# Patient Record
Sex: Female | Born: 1985 | Race: Black or African American | Hispanic: No | Marital: Married | Smoking: Light tobacco smoker
Health system: Southern US, Community
[De-identification: ages and names within clinical notes are randomized; demographics above are authoritative.]

## PROBLEM LIST (undated history)

## (undated) DIAGNOSIS — Z789 Other specified health status: Secondary | ICD-10-CM

## (undated) DIAGNOSIS — Z3490 Encounter for supervision of normal pregnancy, unspecified, unspecified trimester: Secondary | ICD-10-CM

## (undated) DIAGNOSIS — I341 Nonrheumatic mitral (valve) prolapse: Secondary | ICD-10-CM

## (undated) HISTORY — DX: Nonrheumatic mitral (valve) prolapse: I34.1

## (undated) HISTORY — PX: TONSILLECTOMY: SUR1361

## (undated) HISTORY — PX: WISDOM TOOTH EXTRACTION: SHX21

## (undated) HISTORY — DX: Encounter for supervision of normal pregnancy, unspecified, unspecified trimester: Z34.90

---
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
## 2007-05-13 ENCOUNTER — Ambulatory Visit: Payer: Self-pay | Admitting: Otolaryngology

## 2010-07-07 ENCOUNTER — Emergency Department: Payer: Self-pay | Admitting: Emergency Medicine

## 2012-05-27 NOTE — L&D Delivery Note (Signed)
Delivery Note At 5:58 PM a viable and healthy female was delivered via Vaginal, Spontaneous Delivery (Presentation: Left Occiput Anterior).  APGAR: 8, 9; weight .   Placenta status: Intact, Spontaneous.  Cord: 3 vessels with the following complications: None.   Anesthesia: Epidural  Episiotomy: None Lacerations: 2nd degree Suture Repair: 3.0 vicryl on ct and 4.0 vicryl on CT Est. Blood Loss (mL): 300  Mom to postpartum.  Baby to Couplet care / Skin to Skin.  Tawana Scale 03/26/2013, 6:33 PM  NSVD over 2nd degree tear with active management of 3rd stage with pit and traction. spont delivery of placenta 3v cord and intact. Perilabial tear repaired with 4.0 vicryl on SH and 2nd degree repaired with 3.0 vicryl on CT in usual fashion. Hemostatic at completion. EBL 300. Counts sharps and sponges correct.   Delivery Summary for [employer]  Labor Events:   Preterm labor:   Rupture date:   Rupture time:   Rupture type: Spontaneous  Fluid Color: Yellow  Induction:   Augmentation:   Complications:   Cervical ripening:          Delivery:   Episiotomy:   Lacerations:   Repair suture:   Repair # of packets:   Blood loss (ml): 300

## 2012-08-19 LAB — OB RESULTS CONSOLE HIV ANTIBODY (ROUTINE TESTING): HIV: NONREACTIVE

## 2012-08-19 LAB — OB RESULTS CONSOLE ABO/RH: RH Type: POSITIVE

## 2012-08-19 LAB — OB RESULTS CONSOLE GC/CHLAMYDIA
Chlamydia: NEGATIVE
Gonorrhea: NEGATIVE

## 2012-08-19 LAB — OB RESULTS CONSOLE RUBELLA ANTIBODY, IGM: Rubella: IMMUNE

## 2012-08-19 LAB — OB RESULTS CONSOLE HEPATITIS B SURFACE ANTIGEN: Hepatitis B Surface Ag: NEGATIVE

## 2012-08-19 LAB — OB RESULTS CONSOLE ANTIBODY SCREEN: Antibody Screen: NEGATIVE

## 2012-08-19 LAB — OB RESULTS CONSOLE RPR: RPR: NONREACTIVE

## 2012-10-21 ENCOUNTER — Encounter: Payer: Self-pay | Admitting: *Deleted

## 2012-10-21 ENCOUNTER — Ambulatory Visit (INDEPENDENT_AMBULATORY_CARE_PROVIDER_SITE_OTHER): Payer: BC Managed Care – PPO | Admitting: *Deleted

## 2012-10-21 VITALS — BP 109/62 | Ht 69.5 in | Wt 124.0 lb

## 2012-10-21 DIAGNOSIS — Z3480 Encounter for supervision of other normal pregnancy, unspecified trimester: Secondary | ICD-10-CM

## 2012-10-21 DIAGNOSIS — Z3482 Encounter for supervision of other normal pregnancy, second trimester: Secondary | ICD-10-CM

## 2012-10-21 NOTE — Progress Notes (Signed)
Patient has been seen for prenatal care and will sign a records release to have her labs sent to [place] as well as her first and second trimester screening that was done.  She was scheduled today for her anatomy scan as she is 18 weeks.  She will follow up after the scan to be seen by the physician.

## 2012-10-21 NOTE — Patient Instructions (Signed)
Pregabalin capsules What is this medicine? PREGABALIN (pre GAB a lin) is used to treat nerve pain from diabetes, shingles, spinal cord injury, and fibromyalgia. It is also used to control seizures in epilepsy. This medicine may be used for other purposes; ask your health care provider or pharmacist if you have questions. What should I tell my health care provider before I take this medicine? They need to know if you have any of these conditions: -bleeding problems -heart disease, including heart failure -history of alcohol or drug abuse -kidney disease -suicidal thoughts, plans, or attempt; a previous suicide attempt by you or a family member -an unusual or allergic reaction to pregabalin, gabapentin, other medicines, foods, dyes, or preservatives -pregnant or trying to get pregnant or trying to conceive with your partner -breast-feeding How should I use this medicine? Take this medicine by mouth with a glass of water. Follow the directions on the prescription label. You can take this medicine with or without food. Take your doses at regular intervals. Do not take your medicine more often than directed. Do not stop taking except on your doctor's advice. A special MedGuide will be given to you by the pharmacist with each prescription and refill. Be sure to read this information carefully each time. Talk to your pediatrician regarding the use of this medicine in children. Special care may be needed. Overdosage: If you think you have taken too much of this medicine contact a poison control center or emergency room at once. NOTE: This medicine is only for you. Do not share this medicine with others. What if I miss a dose? If you miss a dose, take it as soon as you can. If it is almost time for your next dose, take only that dose. Do not take double or extra doses. What may interact with this medicine? -alcohol -certain medicines for blood pressure like captopril, enalapril, or  lisinopril -certain medicines for diabetes, like pioglitazone or rosiglitazone -certain medicines for anxiety or sleep -narcotic medicines for pain This list may not describe all possible interactions. Give your health care provider a list of all the medicines, herbs, non-prescription drugs, or dietary supplements you use. Also tell them if you smoke, drink alcohol, or use illegal drugs. Some items may interact with your medicine. What should I watch for while using this medicine? Tell your doctor or healthcare professional if your symptoms do not start to get better or if they get worse. Visit your doctor or health care professional for regular checks on your progress. Do not stop taking except on your doctor's advice. You may develop a severe reaction. Your doctor will tell you how much medicine to take. Wear a medical identification bracelet or chain if you are taking this medicine for seizures, and carry a card that describes your disease and details of your medicine and dosage times. You may get drowsy or dizzy. Do not drive, use machinery, or do anything that needs mental alertness until you know how this medicine affects you. Do not stand or sit up quickly, especially if you are an older patient. This reduces the risk of dizzy or fainting spells. Alcohol may interfere with the effect of this medicine. Avoid alcoholic drinks. If you have a heart condition, like congestive heart failure, and notice that you are retaining water and have swelling in your hands or feet, contact your health care provider immediately. The use of this medicine may increase the chance of suicidal thoughts or actions. Pay special attention to how you are   responding while on this medicine. Any worsening of mood, or thoughts of suicide or dying should be reported to your health care professional right away. This medicine has caused reduced sperm counts in some men. This may interfere with the ability to father a child. You  should talk to your doctor or health care professional if you are concerned about your fertility. Women who become pregnant while using this medicine for seizures may enroll in the [place] [employer] Antiepileptic Drug Pregnancy Registry by calling ([phone]. This registry collects information about the safety of antiepileptic drug use during pregnancy. What side effects may I notice from receiving this medicine? Side effects that you should report to your doctor or health care professional as soon as possible: -allergic reactions like skin rash, itching or hives, swelling of the face, lips, or tongue -breathing problems -changes in vision -chest pain -confusion -jerking or unusual movements of any part of your body -loss of memory -muscle pain, tenderness, or weakness -suicidal thoughts or other mood changes -swelling of the ankles, feet, hands -unusual bruising or bleeding Side effects that usually do not require medical attention (Report these to your doctor or health care professional if they continue or are bothersome.): -dizziness -drowsiness -dry mouth -headache -nausea -tremors -trouble sleeping -weight gain This list may not describe all possible side effects. Call your doctor for medical advice about side effects. You may report side effects to FDA at 1-800-FDA-1088. Where should I keep my medicine? Keep out of the reach of children. This medicine can be abused. Keep your medicine in a safe place to protect it from theft. Do not share this medicine with anyone. Selling or giving away this medicine is dangerous and against the law. Store at room temperature between 15 and 30 degrees C (59 and 86 degrees F). Throw away any unused medicine after the expiration date. NOTE: This sheet is a summary. It may not cover all possible information. If you have questions about this medicine, talk to your doctor, pharmacist, or health care provider.  2012, [employer]. (11/15/2010  8:00:36 PM)

## 2012-10-26 ENCOUNTER — Ambulatory Visit (HOSPITAL_COMMUNITY)
Admission: RE | Admit: 2012-10-26 | Discharge: 2012-10-26 | Disposition: A | Discharge status: Home / Self Care | Payer: BC Managed Care – PPO | Source: Ambulatory Visit | Attending: Obstetrics & Gynecology | Admitting: Obstetrics & Gynecology

## 2012-10-26 DIAGNOSIS — Z3482 Encounter for supervision of other normal pregnancy, second trimester: Secondary | ICD-10-CM

## 2012-10-26 DIAGNOSIS — Z3689 Encounter for other specified antenatal screening: Secondary | ICD-10-CM | POA: Insufficient documentation

## 2012-10-27 ENCOUNTER — Encounter: Payer: Self-pay | Admitting: Obstetrics & Gynecology

## 2012-10-27 DIAGNOSIS — Z3490 Encounter for supervision of normal pregnancy, unspecified, unspecified trimester: Secondary | ICD-10-CM

## 2012-10-27 HISTORY — DX: Encounter for supervision of normal pregnancy, unspecified, unspecified trimester: Z34.90

## 2012-10-28 ENCOUNTER — Ambulatory Visit (INDEPENDENT_AMBULATORY_CARE_PROVIDER_SITE_OTHER): Payer: BC Managed Care – PPO | Admitting: Family Medicine

## 2012-10-28 ENCOUNTER — Encounter: Payer: Self-pay | Admitting: Family Medicine

## 2012-10-28 VITALS — BP 114/65 | Wt 125.0 lb

## 2012-10-28 DIAGNOSIS — I341 Nonrheumatic mitral (valve) prolapse: Secondary | ICD-10-CM

## 2012-10-28 DIAGNOSIS — I059 Rheumatic mitral valve disease, unspecified: Secondary | ICD-10-CM

## 2012-10-28 DIAGNOSIS — Z3492 Encounter for supervision of normal pregnancy, unspecified, second trimester: Secondary | ICD-10-CM

## 2012-10-28 DIAGNOSIS — Z3480 Encounter for supervision of other normal pregnancy, unspecified trimester: Secondary | ICD-10-CM

## 2012-10-28 NOTE — Progress Notes (Signed)
Anatomy U/S reviewed--looks normal needs f/u spine views Transfer from [facility] records.  She reports genetics screen, but unsure of outcome.

## 2012-10-28 NOTE — Patient Instructions (Signed)
Pregnancy - Second Trimester The second trimester of pregnancy (3 to 6 months) is a period of rapid growth for you and your baby. At the end of the sixth month, your baby is about 9 inches long and weighs 1 1/2 pounds. You will begin to feel the baby move between 18 and 20 weeks of the pregnancy. This is called quickening. Weight gain is faster. A clear fluid (colostrum) may leak out of your breasts. You may feel small contractions of the womb (uterus). This is known as false labor or Braxton-Hicks contractions. This is like a practice for labor when the baby is ready to be born. Usually, the problems with morning sickness have usually passed by the end of your first trimester. Some women develop small dark blotches (called cholasma, mask of pregnancy) on their face that usually goes away after the baby is born. Exposure to the sun makes the blotches worse. Acne may also develop in some pregnant women and pregnant women who have acne, may find that it goes away. PRENATAL EXAMS  Blood work may continue to be done during prenatal exams. These tests are done to check on your health and the probable health of your baby. Blood work is used to follow your blood levels (hemoglobin). Anemia (low hemoglobin) is common during pregnancy. Iron and vitamins are given to help prevent this. You will also be checked for diabetes between 24 and 28 weeks of the pregnancy. Some of the previous blood tests may be repeated.  The size of the uterus is measured during each visit. This is to make sure that the baby is continuing to grow properly according to the dates of the pregnancy.  Your blood pressure is checked every prenatal visit. This is to make sure you are not getting toxemia.  Your urine is checked to make sure you do not have an infection, diabetes or protein in the urine.  Your weight is checked often to make sure gains are happening at the suggested rate. This is to ensure that both you and your baby are  growing normally.  Sometimes, an ultrasound is performed to confirm the proper growth and development of the baby. This is a test which bounces harmless sound waves off the baby so your caregiver can more accurately determine due dates. Sometimes, a test is done on the amniotic fluid surrounding the baby. This test is called an amniocentesis. The amniotic fluid is obtained by sticking a needle into the belly (abdomen). This is done to check the chromosomes in instances where there is a concern about possible genetic problems with the baby. It is also sometimes done near the end of pregnancy if an early delivery is required. In this case, it is done to help make sure the baby's lungs are mature enough for the baby to live outside of the womb. CHANGES OCCURING IN THE SECOND TRIMESTER OF PREGNANCY Your body goes through many changes during pregnancy. They vary from person to person. Talk to your caregiver about changes you notice that you are concerned about.  During the second trimester, you will likely have an increase in your appetite. It is normal to have cravings for certain foods. This varies from person to person and pregnancy to pregnancy.  Your lower abdomen will begin to bulge.  You may have to urinate more often because the uterus and baby are pressing on your bladder. It is also common to get more bladder infections during pregnancy. You can help this by drinking lots of fluids   and emptying your bladder before and after intercourse.  You may begin to get stretch marks on your hips, abdomen, and breasts. These are normal changes in the body during pregnancy. There are no exercises or medicines to take that prevent this change.  You may begin to develop swollen and bulging veins (varicose veins) in your legs. Wearing support hose, elevating your feet for 15 minutes, 3 to 4 times a day and limiting salt in your diet helps lessen the problem.  Heartburn may develop as the uterus grows and  pushes up against the stomach. Antacids recommended by your caregiver helps with this problem. Also, eating smaller meals 4 to 5 times a day helps.  Constipation can be treated with a stool softener or adding bulk to your diet. Drinking lots of fluids, and eating vegetables, fruits, and whole grains are helpful.  Exercising is also helpful. If you have been very active up until your pregnancy, most of these activities can be continued during your pregnancy. If you have been less active, it is helpful to start an exercise program such as walking.  Hemorrhoids may develop at the end of the second trimester. Warm sitz baths and hemorrhoid cream recommended by your caregiver helps hemorrhoid problems.  Backaches may develop during this time of your pregnancy. Avoid heavy lifting, wear low heal shoes, and practice good posture to help with backache problems.  Some pregnant women develop tingling and numbness of their hand and fingers because of swelling and tightening of ligaments in the wrist (carpel tunnel syndrome). This goes away after the baby is born.  As your breasts enlarge, you may have to get a bigger bra. Get a comfortable, cotton, support bra. Do not get a nursing bra until the last month of the pregnancy if you will be nursing the baby.  You may get a dark line from your belly button to the pubic area called the linea nigra.  You may develop rosy cheeks because of increase blood flow to the face.  You may develop spider looking lines of the face, neck, arms, and chest. These go away after the baby is born. HOME CARE INSTRUCTIONS   It is extremely important to avoid all smoking, herbs, alcohol, and unprescribed drugs during your pregnancy. These chemicals affect the formation and growth of the baby. Avoid these chemicals throughout the pregnancy to ensure the delivery of a healthy infant.  Most of your home care instructions are the same as suggested for the first trimester of your  pregnancy. Keep your caregiver's appointments. Follow your caregiver's instructions regarding medicine use, exercise, and diet.  During pregnancy, you are providing food for you and your baby. Continue to eat regular, well-balanced meals. Choose foods such as meat, fish, milk and other low fat dairy products, vegetables, fruits, and whole-grain breads and cereals. Your caregiver will tell you of the ideal weight gain.  A physical sexual relationship may be continued up until near the end of pregnancy if there are no other problems. Problems could include early (premature) leaking of amniotic fluid from the membranes, vaginal bleeding, abdominal pain, or other medical or pregnancy problems.  Exercise regularly if there are no restrictions. Check with your caregiver if you are unsure of the safety of some of your exercises. The greatest weight gain will occur in the last 2 trimesters of pregnancy. Exercise will help you:  Control your weight.  Get you in shape for labor and delivery.  Lose weight after you have the baby.  Wear   a good support or jogging bra for breast tenderness during pregnancy. This may help if worn during sleep. Pads or tissues may be used in the bra if you are leaking colostrum.  Do not use hot tubs, steam rooms or saunas throughout the pregnancy.  Wear your seat belt at all times when driving. This protects you and your baby if you are in an accident.  Avoid raw meat, uncooked cheese, cat litter boxes, and soil used by cats. These carry germs that can cause birth defects in the baby.  The second trimester is also a good time to visit your dentist for your dental health if this has not been done yet. Getting your teeth cleaned is okay. Use a soft toothbrush. Brush gently during pregnancy.  It is easier to leak urine during pregnancy. Tightening up and strengthening the pelvic muscles will help with this problem. Practice stopping your urination while you are going to the  bathroom. These are the same muscles you need to strengthen. It is also the muscles you would use as if you were trying to stop from passing gas. You can practice tightening these muscles up 10 times a set and repeating this about 3 times per day. Once you know what muscles to tighten up, do not perform these exercises during urination. It is more likely to contribute to an infection by backing up the urine.  Ask for help if you have financial, counseling, or nutritional needs during pregnancy. Your caregiver will be able to offer counseling for these needs as well as refer you for other special needs.  Your skin may become oily. If so, wash your face with mild soap, use non-greasy moisturizer and oil or cream based makeup. MEDICINES AND DRUG USE IN PREGNANCY  Take prenatal vitamins as directed. The vitamin should contain 1 milligram of folic acid. Keep all vitamins out of reach of children. Only a couple vitamins or tablets containing iron may be fatal to a baby or young child when ingested.  Avoid use of all medicines, including herbs, over-the-counter medicines, not prescribed or suggested by your caregiver. Only take over-the-counter or prescription medicines for pain, discomfort, or fever as directed by your caregiver. Do not use aspirin.  Let your caregiver also know about herbs you may be using.  Alcohol is related to a number of birth defects. This includes fetal alcohol syndrome. All alcohol, in any form, should be avoided completely. Smoking will cause low birth rate and premature babies.  Street or illegal drugs are very harmful to the baby. They are absolutely forbidden. A baby born to an addicted mother will be addicted at birth. The baby will go through the same withdrawal an adult does. SEEK MEDICAL CARE IF:  You have any concerns or worries during your pregnancy. It is better to call with your questions if you feel they cannot wait, rather than worry about them. SEEK IMMEDIATE  MEDICAL CARE IF:   An unexplained oral temperature above 102 F (38.9 C) develops, or as your caregiver suggests.  You have leaking of fluid from the vagina (birth canal). If leaking membranes are suspected, take your temperature and tell your caregiver of this when you call.  There is vaginal spotting, bleeding, or passing clots. Tell your caregiver of the amount and how many pads are used. Light spotting in pregnancy is common, especially following intercourse.  You develop a bad smelling vaginal discharge with a change in the color from clear to white.  You continue to feel   sick to your stomach (nauseated) and have no relief from remedies suggested. You vomit blood or coffee ground-like materials.  You lose more than 2 pounds of weight or gain more than 2 pounds of weight over 1 week, or as suggested by your caregiver.  You notice swelling of your face, hands, feet, or legs.  You get exposed to German measles and have never had them.  You are exposed to fifth disease or chickenpox.  You develop belly (abdominal) pain. Round ligament discomfort is a common non-cancerous (benign) cause of abdominal pain in pregnancy. Your caregiver still must evaluate you.  You develop a bad headache that does not go away.  You develop fever, diarrhea, pain with urination, or shortness of breath.  You develop visual problems, blurry, or double vision.  You fall or are in a car accident or any kind of trauma.  There is mental or physical violence at home. Document Released: 05/07/2001 Document Revised: 02/05/2012 Document Reviewed: 11/09/2008 [employer] Patient Information 2014 [employer].  Breastfeeding A change in hormones during your pregnancy causes growth of your breast tissue and an increase in number and size of milk ducts. The hormone prolactin allows proteins, sugars, and fats from your blood supply to make breast milk in your milk-producing glands. The hormone progesterone prevents  breast milk from being released before the birth of your baby. After the birth of your baby, your progesterone level decreases allowing breast milk to be released. Thoughts of your baby, as well as his or her sucking or crying, can stimulate the release of milk from the milk-producing glands. Deciding to breastfeed (nurse) is one of the best choices you can make for you and your baby. The information that follows gives a brief review of the benefits, as well as other important skills to know about breastfeeding. BENEFITS OF BREASTFEEDING For your baby  The first milk (colostrum) helps your baby's digestive system function better.   There are antibodies in your milk that help your baby fight off infections.   Your baby has a lower incidence of asthma, allergies, and sudden infant death syndrome (SIDS).   The nutrients in breast milk are better for your baby than infant formulas.  Breast milk improves your baby's brain development.   Your baby will have less gas, colic, and constipation.  Your baby is less likely to develop other conditions, such as childhood obesity, asthma, or diabetes mellitus. For you  Breastfeeding helps develop a very special bond between you and your baby.   Breastfeeding is convenient, always available at the correct temperature, and costs nothing.   Breastfeeding helps to burn calories and helps you lose the weight gained during pregnancy.   Breastfeeding makes your uterus contract back down to normal size faster and slows bleeding following delivery.   Breastfeeding mothers have a lower risk of developing osteoporosis or breast or ovarian cancer later in life.  BREASTFEEDING FREQUENCY  A healthy, full-term baby may breastfeed as often as every hour or space his or her feedings to every 3 hours. Breastfeeding frequency will vary from baby to baby.   Newborns should be fed no less than every 2 3 hours during the day and every 4 5 hours during the  night. You should breastfeed a minimum of 8 feedings in a 24 hour period.  Awaken your baby to breastfeed if it has been 3 4 hours since the last feeding.  Breastfeed when you feel the need to reduce the fullness of your breasts or when   your newborn shows signs of hunger. Signs that your baby may be hungry include:  Increased alertness or activity.  Stretching.  Movement of the head from side to side.  Movement of the head and opening of the mouth when the corner of the mouth or cheek is stroked (rooting).  Increased sucking sounds, smacking lips, cooing, sighing, or squeaking.  Hand-to-mouth movements.  Increased sucking of fingers or hands.  Fussing.  Intermittent crying.  Signs of extreme hunger will require calming and consoling before you try to feed your baby. Signs of extreme hunger may include:  Restlessness.  A loud, strong cry.  Screaming.  Frequent feeding will help you make more milk and will help prevent problems, such as sore nipples and engorgement of the breasts.  BREASTFEEDING   Whether lying down or sitting, be sure that the baby's abdomen is facing your abdomen.   Support your breast with 4 fingers under your breast and your thumb above your nipple. Make sure your fingers are well away from your nipple and your baby's mouth.   Stroke your baby's lips gently with your finger or nipple.   When your baby's mouth is open wide enough, place all of your nipple and as much of the colored area around your nipple (areola) as possible into your baby's mouth.  More areola should be visible above his or her upper lip than below his or her lower lip.  Your baby's tongue should be between his or her lower gum and your breast.  Ensure that your baby's mouth is correctly positioned around the nipple (latched). Your baby's lips should create a seal on your breast.  Signs that your baby has effectively latched onto your nipple include:  Tugging or sucking  without pain.  Swallowing heard between sucks.  Absent click or smacking sound.  Muscle movement above and in front of his or her ears with sucking.  Your baby must suck about 2 3 minutes in order to get your milk. Allow your baby to feed on each breast as long as he or she wants. Nurse your baby until he or she unlatches or falls asleep at the first breast, then offer the second breast.  Signs that your baby is full and satisfied include:  A gradual decrease in the number of sucks or complete cessation of sucking.  Falling asleep.  Extension or relaxation of his or her body.  Retention of a small amount of milk in his or her mouth.  Letting go of your breast by himself or herself.  Signs of effective breastfeeding in you include:  Breasts that have increased firmness, weight, and size prior to feeding.  Breasts that are softer after nursing.  Increased milk volume, as well as a change in milk consistency and color by the 5th day of breastfeeding.  Breast fullness relieved by breastfeeding.  Nipples are not sore, cracked, or bleeding.  If needed, break the suction by putting your finger into the corner of your baby's mouth and sliding your finger between his or her gums. Then, remove your breast from his or her mouth.  It is common for babies to spit up a small amount after a feeding.  Babies often swallow air during feeding. This can make babies fussy. Burping your baby between breasts can help with this.  Vitamin D supplements are recommended for babies who get only breast milk.  Avoid using a pacifier during your baby's first 4 6 weeks.  Avoid supplemental feedings of water, formula, or   juice in place of breastfeeding. Breast milk is all the food your baby needs. It is not necessary for your baby to have water or formula. Your breasts will make more milk if supplemental feedings are avoided during the early weeks. HOW TO TELL WHETHER YOUR BABY IS GETTING ENOUGH BREAST  MILK Wondering whether or not your baby is getting enough milk is a common concern among mothers. You can be assured that your baby is getting enough milk if:   Your baby is actively sucking and you hear swallowing.   Your baby seems relaxed and satisfied after a feeding.   Your baby nurses at least 8 12 times in a 24 hour time period.  During the first 3 5 days of age:  Your baby is wetting at least 3 5 diapers in a 24 hour period. The urine should be clear and pale yellow.  Your baby is having at least 3 4 stools in a 24 hour period. The stool should be soft and yellow.  At 5 7 days of age, your baby is having at least 3 6 stools in a 24 hour period. The stool should be seedy and yellow by 5 days of age.  Your baby has a weight loss less than 7 10% during the first 3 days of age.  Your baby does not lose weight after 3 7 days of age.  Your baby gains 4 7 ounces each week after he or she is 4 days of age.  Your baby gains weight by 5 days of age and is back to birth weight within 2 weeks. ENGORGEMENT In the first week after your baby is born, you may experience extremely full breasts (engorgement). When engorged, your breasts may feel heavy, warm, or tender to the touch. Engorgement peaks within 24 48 hours after delivery of your baby.  Engorgement may be reduced by:  Continuing to breastfeed.  Increasing the frequency of breastfeeding.  Taking warm showers or applying warm, moist heat to your breasts just before each feeding. This increases circulation and helps the milk flow.   Gently massaging your breast before and during the feedings. With your fingertips, massage from your chest wall towards your nipple in a circular motion.   Ensuring that your baby empties at least one breast at every feeding. It also helps to start the next feeding on the opposite breast.   Expressing breast milk by hand or by using a breast pump to empty the breasts if your baby is sleepy, or  not nursing well. You may also want to express milk if you are returning to work oryou feel you are getting engorged.  Ensuring your baby is latched on and positioned properly while breastfeeding. If you follow these suggestions, your engorgement should improve in 24 48 hours. If you are still experiencing difficulty, call your lactation consultant or caregiver.  CARING FOR YOURSELF Take care of your breasts.  Bathe or shower daily.   Avoid using soap on your nipples.   Wear a supportive bra. Avoid wearing underwire style bras.  Air dry your nipples for a 3 4minutes after each feeding.   Use only cotton bra pads to absorb breast milk leakage. Leaking of breast milk between feedings is normal.   Use only pure lanolin on your nipples after nursing. You do not need to wash it off before feeding your baby again. Another option is to express a few drops of breast milk and gently massage that milk into your nipples.  Continue   breast self-awareness checks. Take care of yourself.  Eat healthy foods. Alternate 3 meals with 3 snacks.  Avoid foods that you notice affect your baby in a bad way.  Drink milk, fruit juice, and water to satisfy your thirst (about 8 glasses a day).   Rest often, relax, and take your prenatal vitamins to prevent fatigue, stress, and anemia.  Avoid chewing and smoking tobacco.  Avoid alcohol and drug use.  Take over-the-counter and prescribed medicine only as directed by your caregiver or pharmacist. You should always check with your caregiver or pharmacist before taking any new medicine, vitamin, or herbal supplement.  Know that pregnancy is possible while breastfeeding. If desired, talk to your caregiver about family planning and safe birth control methods that may be used while breastfeeding. SEEK MEDICAL CARE IF:   You feel like you want to stop breastfeeding or have become frustrated with breastfeeding.  You have painful breasts or nipples.  Your  nipples are cracked or bleeding.  Your breasts are red, tender, or warm.  You have a swollen area on either breast.  You have a fever or chills.  You have nausea or vomiting.  You have drainage from your nipples.  Your breasts do not become full before feedings by the 5th day after delivery.  You feel sad and depressed.  Your baby is too sleepy to eat well.  Your baby is having trouble sleeping.   Your baby is wetting less than 3 diapers in a 24 hour period.  Your baby has less than 3 stools in a 24 hour period.  Your baby's skin or the white part of his or her eyes becomes more yellow.   Your baby is not gaining weight by 5 days of age. MAKE SURE YOU:   Understand these instructions.  Will watch your condition.  Will get help right away if you are not doing well or get worse. Document Released: 05/13/2005 Document Revised: 02/05/2012 Document Reviewed: 12/18/2011 [employer] Patient Information 2014 [employer].  

## 2012-10-28 NOTE — Progress Notes (Signed)
[id number]=[phone] 

## 2012-11-05 ENCOUNTER — Ambulatory Visit (HOSPITAL_COMMUNITY)
Admission: RE | Admit: 2012-11-05 | Discharge: 2012-11-05 | Disposition: A | Discharge status: Home / Self Care | Payer: BC Managed Care – PPO | Source: Ambulatory Visit | Attending: Family Medicine | Admitting: Family Medicine

## 2012-11-05 ENCOUNTER — Other Ambulatory Visit: Payer: Self-pay | Admitting: Family Medicine

## 2012-11-05 DIAGNOSIS — Z3492 Encounter for supervision of normal pregnancy, unspecified, second trimester: Secondary | ICD-10-CM

## 2012-11-05 DIAGNOSIS — Z3689 Encounter for other specified antenatal screening: Secondary | ICD-10-CM | POA: Insufficient documentation

## 2012-11-25 ENCOUNTER — Ambulatory Visit (INDEPENDENT_AMBULATORY_CARE_PROVIDER_SITE_OTHER): Payer: BC Managed Care – PPO | Admitting: Obstetrics & Gynecology

## 2012-11-25 VITALS — BP 113/72 | Wt 132.0 lb

## 2012-11-25 DIAGNOSIS — I341 Nonrheumatic mitral (valve) prolapse: Secondary | ICD-10-CM

## 2012-11-25 DIAGNOSIS — I059 Rheumatic mitral valve disease, unspecified: Secondary | ICD-10-CM

## 2012-11-25 DIAGNOSIS — Z3480 Encounter for supervision of other normal pregnancy, unspecified trimester: Secondary | ICD-10-CM

## 2012-11-25 DIAGNOSIS — Z3492 Encounter for supervision of normal pregnancy, unspecified, second trimester: Secondary | ICD-10-CM

## 2012-11-25 NOTE — Patient Instructions (Signed)
Tetanus, Diphtheria (Td) or Tetanus, Diphtheria, Pertussis (Tdap) Vaccine What You Need to Know WHY GET VACCINATED? Tetanus, diphtheria and pertussis can be very serious diseases. TETANUS (Lockjaw) causes painful muscle spasms and stiffness, usually all over the body.  Tetanus can lead to tightening of muscles in the head and neck so the victim cannot open his mouth or swallow, or sometimes even breathe. Tetanus kills about 1 out of 5 people who are infected. DIPHTHERIA can cause a thick membrane to cover the back of the throat.  Diphtheria can lead to breathing problems, paralysis, heart failure, and even death. PERTUSSIS (Whooping Cough) causes severe coughing spells which can lead to difficulty breathing, vomiting, and disturbed sleep.  Pertussis can lead to weight loss, incontinence, rib fractures and passing out from violent coughing. Up to 2 in 100 adolescents and 5 in 100 adults with pertussis are hospitalized or have complications, including pneumonia and death. These 3 diseases are all caused by bacteria. Diphtheria and pertussis are spread from person to person. Tetanus enters the body through cuts, scratches, or wounds. The [place] saw as many as 200,000 cases a year of diphtheria and pertussis before vaccines were available, and hundreds of cases of tetanus. Since then, tetanus and diphtheria cases have dropped by about 99% and pertussis cases by about 92%. Children 6 years of age and younger get DTaP vaccine to protect them from these three diseases. But older children, adolescents, and adults need protection too. VACCINES FOR ADOLESCENTS AND ADULTS: TD AND TDAP Two vaccines are available to protect people 7 years of age and older from these diseases:  Td vaccine has been used for many years. It protects against tetanus and diphtheria.  Tdap vaccine was licensed in 2005. It is the first vaccine for adolescents and adults that protects against pertussis as well as tetanus and  diphtheria. A Td booster dose is recommended every 10 years. Tdap is given only once. WHICH VACCINE, AND WHEN? Ages 7 through 18 years  A dose of Tdap is recommended at age 11 or 12. This dose could be given as early as age 7 for children who missed one or more childhood doses of DTaP.  Children and adolescents who did not get a complete series of DTaP shots by age 7 should complete the series using a combination of Td and Tdap. Age 19 years and Older  All adults should get a booster dose of Td every 10 years. Adults under 65 who have never gotten Tdap should get a dose of Tdap as their next booster dose. Adults 65 and older may get one booster dose of Tdap.  Adults (including women who may become pregnant and adults 65 and older) who expect to have close contact with a baby younger than 12 months of age should get a dose of Tdap to help protect the baby from pertussis.  Healthcare professionals who have direct patient contact in hospitals or clinics should get one dose of Tdap. Protection After a Wound  A person who gets a severe cut or burn might need a dose of Td or Tdap to prevent tetanus infection. Tdap should be used for anyone who has never had a dose previously. Td should be used if Tdap is not available, or for:  Anybody who has already had a dose of Tdap.  Children 7 through 9 years of age who completed the childhood DTaP series.  Adults 65 and older. Pregnant Women  Pregnant women who have never had a dose of Tdap   should get one, after the 20th week of gestation and preferably during the 3rd trimester. If they do not get Tdap during their pregnancy they should get a dose as soon as possible after delivery. Pregnant women who have previously received Tdap and need tetanus or diphtheria vaccine while pregnant should get Td. Tdap and Td may be given at the same time as other vaccines. SOME PEOPLE SHOULD NOT BE VACCINATED OR SHOULD WAIT  Anyone who has had a life-threatening  allergic reaction after a dose of any tetanus, diphtheria, or pertussis containing vaccine should not get Td or Tdap.  Anyone who has a severe allergy to any component of a vaccine should not get that vaccine. Tell your doctor if the person getting the vaccine has any severe allergies.  Anyone who had a coma, or long or multiple seizures within 7 days after a dose of DTP or DTaP should not get Tdap, unless a cause other than the vaccine was found. These people may get Td.  Talk to your doctor if the person getting either vaccine:  Has epilepsy or another nervous system problem.  Had severe swelling or severe pain after a previous dose of DTP, DTaP, DT, Td, or Tdap vaccine.  Has had Guillain Barr Syndrome (GBS). Anyone who has a moderate or severe illness on the day the shot is scheduled should usually wait until they recover before getting Tdap or Td vaccine. A person with a mild illness or low fever can usually be vaccinated. WHAT ARE THE RISKS FROM TDAP AND TD VACCINES? With a vaccine, as with any medicine, there is always a small risk of a life-threatening allergic reaction or other serious problem. Brief fainting spells and related symptoms (such as jerking movements) can happen after any medical procedure, including vaccination. Sitting or lying down for about 15 minutes after a vaccination can help prevent fainting and injuries caused by falls. Tell your doctor if the patient feels dizzy or lightheaded, or has vision changes or ringing in the ears. Getting tetanus, diphtheria, or pertussis would be much more likely to lead to severe problems than getting either Td or Tdap vaccine. Problems reported after Td and Tdap vaccines are listed below. Mild Problems (noticeable, but did not interfere with activities) Tdap  Pain (about 3 in 4 adolescents and 2 in 3 adults).  Redness or swelling (about 1 in 5).  Mild fever of at least 100.4 F (38 C) (up to about 1 in 25 adolescents and 1 in  100 adults).  Headache (about 4 in 10 adolescents and 3 in 10 adults).  Tiredness (about 1 in 3 adolescents and 1 in 4 adults).  Nausea, vomiting, diarrhea, or stomach ache (up to 1 in 4 adolescents and 1 in 10 adults).  Chills, body aches, sore joints, rash, or swollen glands (uncommon). Td  Pain (up to about 8 in 10).  Redness or swelling at the injection site (up to about 1 in 3).  Mild fever (up to about 1 in 15).  Headache or tiredness (uncommon). Moderate Problems (interfered with activities, but did not require medical attention) Tdap  Pain at the injection site (about 1 in 20 adolescents and 1 in 100 adults).  Redness or swelling at the injection site (up to about 1 in 16 adolescents and 1 in 25 adults).  Fever over 102 F (38.9 C) (about 1 in 100 adolescents and 1 in 250 adults).  Headache (1 in 300).  Nausea, vomiting, diarrhea, or stomach ache (up to 3   in 100 adolescents and 1 in 100 adults). Td  Fever over 102 F (38.9 C) (rare). Tdap or Td  Extensive swelling of the arm where the shot was given (up to about 3 in 100). Severe Problems (Unable to perform usual activities; required medical attention) Tdap or Td  Swelling, severe pain, bleeding, and redness in the arm where the shot was given (rare). A severe allergic reaction could occur after any vaccine. They are estimated to occur less than once in a million doses. WHAT IF THERE IS A SEVERE REACTION? What should I look for? Any unusual condition, such as a severe allergic reaction or a high fever. If a severe allergic reaction occurred, it would be within a few minutes to an hour after the shot. Signs of a serious allergic reaction can include difficulty breathing, weakness, hoarseness or wheezing, a fast heartbeat, hives, dizziness, paleness, or swelling of the throat. What should I do?  Call a doctor, or get the person to a doctor right away.  Tell your doctor what happened, the date and time it  happened, and when the vaccination was given.  Ask your provider to report the reaction by filing a Vaccine Adverse Event Reporting System (VAERS) form. Or, you can file this report through the VAERS website at www.vaers.hhs.gov or by calling 1-800-822-7967. VAERS does not provide medical advice. THE NATIONAL VACCINE INJURY COMPENSATION PROGRAM The National Vaccine Injury Compensation Program (VICP) was created in 1986. Persons who believe they may have been injured by a vaccine can learn about the program and about filing a claim by calling 1-800-338-2382 or visiting the VICP website at www.hrsa.gov/vaccinecompensation. HOW CAN I LEARN MORE?  Your doctor can give you the vaccine package insert or suggest other sources of information.  Call your local or state health department.  Contact the Centers for Disease Control and Prevention (CDC):  Call 1-800-232-4636 (1-800-CDC-INFO).  Visit the CDC website at www.cdc.gov/vaccines. CDC Td and Tdap Interim VIS (06/19/10) Document Released: 03/10/2006 Document Revised: 08/05/2011 Document Reviewed: 06/19/2010 [employer] Patient Information 2014 [employer].  

## 2012-11-25 NOTE — Progress Notes (Signed)
P-90 

## 2012-11-25 NOTE — Progress Notes (Signed)
Third trimester labs, Tdap next visit.  Will order ECHO to evaluate mitral valve prolapse.  Awaiting records from [facility].  No other complaints or concerns.  Fetal movement and labor precautions reviewed.

## 2012-12-24 ENCOUNTER — Ambulatory Visit (INDEPENDENT_AMBULATORY_CARE_PROVIDER_SITE_OTHER): Payer: BC Managed Care – PPO | Admitting: Obstetrics and Gynecology

## 2012-12-24 ENCOUNTER — Encounter: Payer: Self-pay | Admitting: Obstetrics and Gynecology

## 2012-12-24 VITALS — BP 103/67 | Wt 140.0 lb

## 2012-12-24 DIAGNOSIS — Z3480 Encounter for supervision of other normal pregnancy, unspecified trimester: Secondary | ICD-10-CM

## 2012-12-24 DIAGNOSIS — Z23 Encounter for immunization: Secondary | ICD-10-CM

## 2012-12-24 DIAGNOSIS — Z3492 Encounter for supervision of normal pregnancy, unspecified, second trimester: Secondary | ICD-10-CM

## 2012-12-24 LAB — CBC
HCT: 31.5 % — ABNORMAL LOW (ref 36.0–46.0)
Hemoglobin: 10.8 g/dL — ABNORMAL LOW (ref 12.0–15.0)
MCH: 29.8 pg (ref 26.0–34.0)
MCHC: 34.3 g/dL (ref 30.0–36.0)
MCV: 87 fL (ref 78.0–100.0)
Platelets: 224 10*3/uL (ref 150–400)
RBC: 3.62 MIL/uL — ABNORMAL LOW (ref 3.87–5.11)
RDW: 13.6 % (ref 11.5–15.5)
WBC: 6.7 10*3/uL (ref 4.0–10.5)

## 2012-12-24 MED ORDER — TETANUS-DIPHTH-ACELL PERTUSSIS 5-2.5-18.5 LF-MCG/0.5 IM SUSP: 0.5000 mL | Freq: Once | INTRAMUSCULAR | Status: DC

## 2012-12-24 NOTE — Progress Notes (Signed)
Cardiology referral provided for maternal echo

## 2012-12-24 NOTE — Progress Notes (Signed)
Patient doing well without complaints. FM/PTL precautions reviewed. 1hr glucola, TDap and labs today. Will need to review prenatal records at next visit

## 2012-12-24 NOTE — Addendum Note (Signed)
Addended by: Barbara Cower on: 12/24/2012 03:35 PM   Modules accepted: Orders

## 2012-12-25 LAB — RPR

## 2012-12-29 LAB — GLUCOSE TOLERANCE, 1 HOUR (50G) W/O FASTING: Glucose, 1 Hour GTT: 101 mg/dL (ref 70–140)

## 2012-12-30 ENCOUNTER — Encounter: Payer: Self-pay | Admitting: Obstetrics and Gynecology

## 2013-01-07 ENCOUNTER — Ambulatory Visit (INDEPENDENT_AMBULATORY_CARE_PROVIDER_SITE_OTHER): Payer: BC Managed Care – PPO | Admitting: Obstetrics & Gynecology

## 2013-01-07 VITALS — BP 117/72 | Wt 142.0 lb

## 2013-01-07 DIAGNOSIS — Z3480 Encounter for supervision of other normal pregnancy, unspecified trimester: Secondary | ICD-10-CM

## 2013-01-07 DIAGNOSIS — Z3493 Encounter for supervision of normal pregnancy, unspecified, third trimester: Secondary | ICD-10-CM

## 2013-01-07 NOTE — Patient Instructions (Signed)
Return to clinic for any obstetric concerns or go to [facility] for evaluation  

## 2013-01-07 NOTE — Progress Notes (Signed)
ECHO is still not scheduled.  Will make sure that this gets scheduled. No other complaints or concerns.  Fetal movement and labor precautions reviewed.

## 2013-01-07 NOTE — Progress Notes (Signed)
P = 92 

## 2013-01-15 ENCOUNTER — Other Ambulatory Visit (HOSPITAL_COMMUNITY): Payer: BC Managed Care – PPO

## 2013-01-22 ENCOUNTER — Ambulatory Visit (INDEPENDENT_AMBULATORY_CARE_PROVIDER_SITE_OTHER): Payer: BC Managed Care – PPO | Admitting: Family Medicine

## 2013-01-22 VITALS — BP 84/62 | Wt 142.0 lb

## 2013-01-22 DIAGNOSIS — I341 Nonrheumatic mitral (valve) prolapse: Secondary | ICD-10-CM

## 2013-01-22 DIAGNOSIS — I059 Rheumatic mitral valve disease, unspecified: Secondary | ICD-10-CM

## 2013-01-22 DIAGNOSIS — Z3480 Encounter for supervision of other normal pregnancy, unspecified trimester: Secondary | ICD-10-CM

## 2013-01-22 DIAGNOSIS — Z3493 Encounter for supervision of normal pregnancy, unspecified, third trimester: Secondary | ICD-10-CM

## 2013-01-22 NOTE — Assessment & Plan Note (Signed)
Doing well 

## 2013-01-22 NOTE — Patient Instructions (Signed)
Pregnancy - Third Trimester The third trimester of pregnancy (the last 3 months) is a period of the most rapid growth for you and your baby. The baby approaches a length of 20 inches and a weight of 6 to 10 pounds. The baby is adding on fat and getting ready for life outside your body. While inside, babies have periods of sleeping and waking, sucking thumbs, and hiccuping. You can often feel small contractions of the uterus. This is false labor. It is also called Braxton-Hicks contractions. This is like a practice for labor. The usual problems in this stage of pregnancy include more difficulty breathing, swelling of the hands and feet from water retention, and having to urinate more often because of the uterus and baby pressing on your bladder.  PRENATAL EXAMS  Blood work may continue to be done during prenatal exams. These tests are done to check on your health and the probable health of your baby. Blood work is used to follow your blood levels (hemoglobin). Anemia (low hemoglobin) is common during pregnancy. Iron and vitamins are given to help prevent this. You may also continue to be checked for diabetes. Some of the past blood tests may be done again.  The size of the uterus is measured during each visit. This makes sure your baby is growing properly according to your pregnancy dates.  Your blood pressure is checked every prenatal visit. This is to make sure you are not getting toxemia.  Your urine is checked every prenatal visit for infection, diabetes, and protein.  Your weight is checked at each visit. This is done to make sure gains are happening at the suggested rate and that you and your baby are growing normally.  Sometimes, an ultrasound is performed to confirm the position and the proper growth and development of the baby. This is a test done that bounces harmless sound waves off the baby so your caregiver can more accurately determine a due date.  Discuss the type of pain medicine and  anesthesia you will have during your labor and delivery.  Discuss the possibility and anesthesia if a cesarean section might be necessary.  Inform your caregiver if there is any mental or physical violence at home. Sometimes, a specialized non-stress test, contraction stress test, and biophysical profile are done to make sure the baby is not having a problem. Checking the amniotic fluid surrounding the baby is called an amniocentesis. The amniotic fluid is removed by sticking a needle into the belly (abdomen). This is sometimes done near the end of pregnancy if an early delivery is required. In this case, it is done to help make sure the baby's lungs are mature enough for the baby to live outside of the womb. If the lungs are not mature and it is unsafe to deliver the baby, an injection of cortisone medicine is given to the mother 1 to 2 days before the delivery. This helps the baby's lungs mature and makes it safer to deliver the baby. CHANGES OCCURING IN THE THIRD TRIMESTER OF PREGNANCY Your body goes through many changes during pregnancy. They vary from person to person. Talk to your caregiver about changes you notice and are concerned about.  During the last trimester, you have probably had an increase in your appetite. It is normal to have cravings for certain foods. This varies from person to person and pregnancy to pregnancy.  You may begin to get stretch marks on your hips, abdomen, and breasts. These are normal changes in the body   during pregnancy. There are no exercises or medicines to take which prevent this change.  Constipation may be treated with a stool softener or adding bulk to your diet. Drinking lots of fluids, fiber in vegetables, fruits, and whole grains are helpful.  Exercising is also helpful. If you have been very active up until your pregnancy, most of these activities can be continued during your pregnancy. If you have been less active, it is helpful to start an exercise  program such as walking. Consult your caregiver before starting exercise programs.  Avoid all smoking, alcohol, non-prescribed drugs, herbs and "street drugs" during your pregnancy. These chemicals affect the formation and growth of the baby. Avoid chemicals throughout the pregnancy to ensure the delivery of a healthy infant.  Backache, varicose veins, and hemorrhoids may develop or get worse.  You will tire more easily in the third trimester, which is normal.  The baby's movements may be stronger and more often.  You may become short of breath easily.  Your belly button may stick out.  A yellow discharge may leak from your breasts called colostrum.  You may have a bloody mucus discharge. This usually occurs a few days to a week before labor begins. HOME CARE INSTRUCTIONS   Keep your caregiver's appointments. Follow your caregiver's instructions regarding medicine use, exercise, and diet.  During pregnancy, you are providing food for you and your baby. Continue to eat regular, well-balanced meals. Choose foods such as meat, fish, milk and other low fat dairy products, vegetables, fruits, and whole-grain breads and cereals. Your caregiver will tell you of the ideal weight gain.  A physical sexual relationship may be continued throughout pregnancy if there are no other problems such as early (premature) leaking of amniotic fluid from the membranes, vaginal bleeding, or belly (abdominal) pain.  Exercise regularly if there are no restrictions. Check with your caregiver if you are unsure of the safety of your exercises. Greater weight gain will occur in the last 2 trimesters of pregnancy. Exercising helps:  Control your weight.  Get you in shape for labor and delivery.  You lose weight after you deliver.  Rest a lot with legs elevated, or as needed for leg cramps or low back pain.  Wear a good support or jogging bra for breast tenderness during pregnancy. This may help if worn during  sleep. Pads or tissues may be used in the bra if you are leaking colostrum.  Do not use hot tubs, steam rooms, or saunas.  Wear your seat belt when driving. This protects you and your baby if you are in an accident.  Avoid raw meat, cat litter boxes and soil used by cats. These carry germs that can cause birth defects in the baby.  It is easier to leak urine during pregnancy. Tightening up and strengthening the pelvic muscles will help with this problem. You can practice stopping your urination while you are going to the bathroom. These are the same muscles you need to strengthen. It is also the muscles you would use if you were trying to stop from passing gas. You can practice tightening these muscles up 10 times a set and repeating this about 3 times per day. Once you know what muscles to tighten up, do not perform these exercises during urination. It is more likely to cause an infection by backing up the urine.  Ask for help if you have financial, counseling, or nutritional needs during pregnancy. Your caregiver will be able to offer counseling for these   needs as well as refer you for other special needs.  Make a list of emergency phone numbers and have them available.  Plan on getting help from family or friends when you go home from the hospital.  Make a trial run to the hospital.  Take prenatal classes with the father to understand, practice, and ask questions about the labor and delivery.  Prepare the baby's room or nursery.  Do not travel out of the city unless it is absolutely necessary and with the advice of your caregiver.  Wear only low or no heal shoes to have better balance and prevent falling. MEDICINES AND DRUG USE IN PREGNANCY  Take prenatal vitamins as directed. The vitamin should contain 1 milligram of folic acid. Keep all vitamins out of reach of children. Only a couple vitamins or tablets containing iron may be fatal to a baby or young child when ingested.  Avoid use  of all medicines, including herbs, over-the-counter medicines, not prescribed or suggested by your caregiver. Only take over-the-counter or prescription medicines for pain, discomfort, or fever as directed by your caregiver. Do not use aspirin, ibuprofen or naproxen unless approved by your caregiver.  Let your caregiver also know about herbs you may be using.  Alcohol is related to a number of birth defects. This includes fetal alcohol syndrome. All alcohol, in any form, should be avoided completely. Smoking will cause low birth rate and premature babies.  Illegal drugs are very harmful to the baby. They are absolutely forbidden. A baby born to an addicted mother will be addicted at birth. The baby will go through the same withdrawal an adult does. SEEK MEDICAL CARE IF: You have any concerns or worries during your pregnancy. It is better to call with your questions if you feel they cannot wait, rather than worry about them. SEEK IMMEDIATE MEDICAL CARE IF:   An unexplained oral temperature above 102 F (38.9 C) develops, or as your caregiver suggests.  You have leaking of fluid from the vagina. If leaking membranes are suspected, take your temperature and tell your caregiver of this when you call.  There is vaginal spotting, bleeding or passing clots. Tell your caregiver of the amount and how many pads are used.  You develop a bad smelling vaginal discharge with a change in the color from clear to white.  You develop vomiting that lasts more than 24 hours.  You develop chills or fever.  You develop shortness of breath.  You develop burning on urination.  You loose more than 2 pounds of weight or gain more than 2 pounds of weight or as suggested by your caregiver.  You notice sudden swelling of your face, hands, and feet or legs.  You develop belly (abdominal) pain. Round ligament discomfort is a common non-cancerous (benign) cause of abdominal pain in pregnancy. Your caregiver still  must evaluate you.  You develop a severe headache that does not go away.  You develop visual problems, blurred or double vision.  If you have not felt your baby move for more than 1 hour. If you think the baby is not moving as much as usual, eat something with sugar in it and lie down on your left side for an hour. The baby should move at least 4 to 5 times per hour. Call right away if your baby moves less than that.  You fall, are in a car accident, or any kind of trauma.  There is mental or physical violence at home. Document Released: 05/07/2001   Document Revised: 02/05/2012 Document Reviewed: 11/09/2008 [employer] Patient Information 2014 [employer].  Breastfeeding A change in hormones during your pregnancy causes growth of your breast tissue and an increase in number and size of milk ducts. The hormone prolactin allows proteins, sugars, and fats from your blood supply to make breast milk in your milk-producing glands. The hormone progesterone prevents breast milk from being released before the birth of your baby. After the birth of your baby, your progesterone level decreases allowing breast milk to be released. Thoughts of your baby, as well as his or her sucking or crying, can stimulate the release of milk from the milk-producing glands. Deciding to breastfeed (nurse) is one of the best choices you can make for you and your baby. The information that follows gives a brief review of the benefits, as well as other important skills to know about breastfeeding. BENEFITS OF BREASTFEEDING For your baby  The first milk (colostrum) helps your baby's digestive system function better.   There are antibodies in your milk that help your baby fight off infections.   Your baby has a lower incidence of asthma, allergies, and sudden infant death syndrome (SIDS).   The nutrients in breast milk are better for your baby than infant formulas.  Breast milk improves your baby's brain development.    Your baby will have less gas, colic, and constipation.  Your baby is less likely to develop other conditions, such as childhood obesity, asthma, or diabetes mellitus. For you  Breastfeeding helps develop a very special bond between you and your baby.   Breastfeeding is convenient, always available at the correct temperature, and costs nothing.   Breastfeeding helps to burn calories and helps you lose the weight gained during pregnancy.   Breastfeeding makes your uterus contract back down to normal size faster and slows bleeding following delivery.   Breastfeeding mothers have a lower risk of developing osteoporosis or breast or ovarian cancer later in life.  BREASTFEEDING FREQUENCY  A healthy, full-term baby may breastfeed as often as every hour or space his or her feedings to every 3 hours. Breastfeeding frequency will vary from baby to baby.   Newborns should be fed no less than every 2 3 hours during the day and every 4 5 hours during the night. You should breastfeed a minimum of 8 feedings in a 24 hour period.  Awaken your baby to breastfeed if it has been 3 4 hours since the last feeding.  Breastfeed when you feel the need to reduce the fullness of your breasts or when your newborn shows signs of hunger. Signs that your baby may be hungry include:  Increased alertness or activity.  Stretching.  Movement of the head from side to side.  Movement of the head and opening of the mouth when the corner of the mouth or cheek is stroked (rooting).  Increased sucking sounds, smacking lips, cooing, sighing, or squeaking.  Hand-to-mouth movements.  Increased sucking of fingers or hands.  Fussing.  Intermittent crying.  Signs of extreme hunger will require calming and consoling before you try to feed your baby. Signs of extreme hunger may include:  Restlessness.  A loud, strong cry.  Screaming.  Frequent feeding will help you make more milk and will help prevent  problems, such as sore nipples and engorgement of the breasts.  BREASTFEEDING   Whether lying down or sitting, be sure that the baby's abdomen is facing your abdomen.   Support your breast with 4 fingers under your breast   and your thumb above your nipple. Make sure your fingers are well away from your nipple and your baby's mouth.   Stroke your baby's lips gently with your finger or nipple.   When your baby's mouth is open wide enough, place all of your nipple and as much of the colored area around your nipple (areola) as possible into your baby's mouth.  More areola should be visible above his or her upper lip than below his or her lower lip.  Your baby's tongue should be between his or her lower gum and your breast.  Ensure that your baby's mouth is correctly positioned around the nipple (latched). Your baby's lips should create a seal on your breast.  Signs that your baby has effectively latched onto your nipple include:  Tugging or sucking without pain.  Swallowing heard between sucks.  Absent click or smacking sound.  Muscle movement above and in front of his or her ears with sucking.  Your baby must suck about 2 3 minutes in order to get your milk. Allow your baby to feed on each breast as long as he or she wants. Nurse your baby until he or she unlatches or falls asleep at the first breast, then offer the second breast.  Signs that your baby is full and satisfied include:  A gradual decrease in the number of sucks or complete cessation of sucking.  Falling asleep.  Extension or relaxation of his or her body.  Retention of a small amount of milk in his or her mouth.  Letting go of your breast by himself or herself.  Signs of effective breastfeeding in you include:  Breasts that have increased firmness, weight, and size prior to feeding.  Breasts that are softer after nursing.  Increased milk volume, as well as a change in milk consistency and color by the 5th  day of breastfeeding.  Breast fullness relieved by breastfeeding.  Nipples are not sore, cracked, or bleeding.  If needed, break the suction by putting your finger into the corner of your baby's mouth and sliding your finger between his or her gums. Then, remove your breast from his or her mouth.  It is common for babies to spit up a small amount after a feeding.  Babies often swallow air during feeding. This can make babies fussy. Burping your baby between breasts can help with this.  Vitamin D supplements are recommended for babies who get only breast milk.  Avoid using a pacifier during your baby's first 4 6 weeks.  Avoid supplemental feedings of water, formula, or juice in place of breastfeeding. Breast milk is all the food your baby needs. It is not necessary for your baby to have water or formula. Your breasts will make more milk if supplemental feedings are avoided during the early weeks. HOW TO TELL WHETHER YOUR BABY IS GETTING ENOUGH BREAST MILK Wondering whether or not your baby is getting enough milk is a common concern among mothers. You can be assured that your baby is getting enough milk if:   Your baby is actively sucking and you hear swallowing.   Your baby seems relaxed and satisfied after a feeding.   Your baby nurses at least 8 12 times in a 24 hour time period.  During the first 3 5 days of age:  Your baby is wetting at least 3 5 diapers in a 24 hour period. The urine should be clear and pale yellow.  Your baby is having at least 3 4 stools in   a 24 hour period. The stool should be soft and yellow.  At 5 7 days of age, your baby is having at least 3 6 stools in a 24 hour period. The stool should be seedy and yellow by 5 days of age.  Your baby has a weight loss less than 7 10% during the first 3 days of age.  Your baby does not lose weight after 3 7 days of age.  Your baby gains 4 7 ounces each week after he or she is 4 days of age.  Your baby gains weight  by 5 days of age and is back to birth weight within 2 weeks. ENGORGEMENT In the first week after your baby is born, you may experience extremely full breasts (engorgement). When engorged, your breasts may feel heavy, warm, or tender to the touch. Engorgement peaks within 24 48 hours after delivery of your baby.  Engorgement may be reduced by:  Continuing to breastfeed.  Increasing the frequency of breastfeeding.  Taking warm showers or applying warm, moist heat to your breasts just before each feeding. This increases circulation and helps the milk flow.   Gently massaging your breast before and during the feedings. With your fingertips, massage from your chest wall towards your nipple in a circular motion.   Ensuring that your baby empties at least one breast at every feeding. It also helps to start the next feeding on the opposite breast.   Expressing breast milk by hand or by using a breast pump to empty the breasts if your baby is sleepy, or not nursing well. You may also want to express milk if you are returning to work oryou feel you are getting engorged.  Ensuring your baby is latched on and positioned properly while breastfeeding. If you follow these suggestions, your engorgement should improve in 24 48 hours. If you are still experiencing difficulty, call your lactation consultant or caregiver.  CARING FOR YOURSELF Take care of your breasts.  Bathe or shower daily.   Avoid using soap on your nipples.   Wear a supportive bra. Avoid wearing underwire style bras.  Air dry your nipples for a 3 4minutes after each feeding.   Use only cotton bra pads to absorb breast milk leakage. Leaking of breast milk between feedings is normal.   Use only pure lanolin on your nipples after nursing. You do not need to wash it off before feeding your baby again. Another option is to express a few drops of breast milk and gently massage that milk into your nipples.  Continue breast  self-awareness checks. Take care of yourself.  Eat healthy foods. Alternate 3 meals with 3 snacks.  Avoid foods that you notice affect your baby in a bad way.  Drink milk, fruit juice, and water to satisfy your thirst (about 8 glasses a day).   Rest often, relax, and take your prenatal vitamins to prevent fatigue, stress, and anemia.  Avoid chewing and smoking tobacco.  Avoid alcohol and drug use.  Take over-the-counter and prescribed medicine only as directed by your caregiver or pharmacist. You should always check with your caregiver or pharmacist before taking any new medicine, vitamin, or herbal supplement.  Know that pregnancy is possible while breastfeeding. If desired, talk to your caregiver about family planning and safe birth control methods that may be used while breastfeeding. SEEK MEDICAL CARE IF:   You feel like you want to stop breastfeeding or have become frustrated with breastfeeding.  You have painful breasts or nipples.    Your nipples are cracked or bleeding.  Your breasts are red, tender, or warm.  You have a swollen area on either breast.  You have a fever or chills.  You have nausea or vomiting.  You have drainage from your nipples.  Your breasts do not become full before feedings by the 5th day after delivery.  You feel sad and depressed.  Your baby is too sleepy to eat well.  Your baby is having trouble sleeping.   Your baby is wetting less than 3 diapers in a 24 hour period.  Your baby has less than 3 stools in a 24 hour period.  Your baby's skin or the white part of his or her eyes becomes more yellow.   Your baby is not gaining weight by 5 days of age. MAKE SURE YOU:   Understand these instructions.  Will watch your condition.  Will get help right away if you are not doing well or get worse. Document Released: 05/13/2005 Document Revised: 02/05/2012 Document Reviewed: 12/18/2011 [employer] Patient Information 2014 [employer].  

## 2013-01-22 NOTE — Progress Notes (Signed)
P=[id number] 

## 2013-01-22 NOTE — Progress Notes (Signed)
ECHO is scheduled next week.

## 2013-01-26 ENCOUNTER — Other Ambulatory Visit: Payer: BC Managed Care – PPO

## 2013-01-26 DIAGNOSIS — R0989 Other specified symptoms and signs involving the circulatory and respiratory systems: Secondary | ICD-10-CM

## 2013-02-04 ENCOUNTER — Ambulatory Visit (INDEPENDENT_AMBULATORY_CARE_PROVIDER_SITE_OTHER): Payer: BC Managed Care – PPO | Admitting: Obstetrics and Gynecology

## 2013-02-04 ENCOUNTER — Encounter: Payer: Self-pay | Admitting: Obstetrics and Gynecology

## 2013-02-04 VITALS — BP 121/72 | Wt 146.0 lb

## 2013-02-04 DIAGNOSIS — Z3493 Encounter for supervision of normal pregnancy, unspecified, third trimester: Secondary | ICD-10-CM

## 2013-02-04 DIAGNOSIS — Z3400 Encounter for supervision of normal first pregnancy, unspecified trimester: Secondary | ICD-10-CM

## 2013-02-04 DIAGNOSIS — Z3402 Encounter for supervision of normal first pregnancy, second trimester: Secondary | ICD-10-CM

## 2013-02-04 DIAGNOSIS — I059 Rheumatic mitral valve disease, unspecified: Secondary | ICD-10-CM

## 2013-02-04 DIAGNOSIS — I341 Nonrheumatic mitral (valve) prolapse: Secondary | ICD-10-CM

## 2013-02-04 MED ORDER — CITRANATAL 90 DHA 90-1 & 300 MG PO MISC: 1.0000 | Freq: Every day | ORAL | Status: DC

## 2013-02-04 NOTE — Progress Notes (Signed)
P=[id number] 

## 2013-02-04 NOTE — Progress Notes (Signed)
Patient doing well without complaints. Rescheduled maternal echo secondary to work for 9/28. FM/PTL precautions reviewed

## 2013-02-05 ENCOUNTER — Other Ambulatory Visit: Payer: Self-pay

## 2013-02-05 ENCOUNTER — Encounter: Payer: Self-pay | Admitting: Obstetrics & Gynecology

## 2013-02-05 ENCOUNTER — Other Ambulatory Visit (INDEPENDENT_AMBULATORY_CARE_PROVIDER_SITE_OTHER): Payer: BC Managed Care – PPO

## 2013-02-05 DIAGNOSIS — I059 Rheumatic mitral valve disease, unspecified: Secondary | ICD-10-CM

## 2013-02-05 DIAGNOSIS — Z3493 Encounter for supervision of normal pregnancy, unspecified, third trimester: Secondary | ICD-10-CM

## 2013-02-16 ENCOUNTER — Other Ambulatory Visit: Payer: BC Managed Care – PPO

## 2013-02-18 ENCOUNTER — Ambulatory Visit (INDEPENDENT_AMBULATORY_CARE_PROVIDER_SITE_OTHER): Payer: BC Managed Care – PPO | Admitting: Family Medicine

## 2013-02-18 ENCOUNTER — Encounter: Payer: Self-pay | Admitting: Family Medicine

## 2013-02-18 VITALS — BP 114/63 | Wt 146.0 lb

## 2013-02-18 DIAGNOSIS — Z23 Encounter for immunization: Secondary | ICD-10-CM

## 2013-02-18 DIAGNOSIS — Z3403 Encounter for supervision of normal first pregnancy, third trimester: Secondary | ICD-10-CM

## 2013-02-18 DIAGNOSIS — Z3400 Encounter for supervision of normal first pregnancy, unspecified trimester: Secondary | ICD-10-CM

## 2013-02-18 DIAGNOSIS — Z3493 Encounter for supervision of normal pregnancy, unspecified, third trimester: Secondary | ICD-10-CM

## 2013-02-18 MED ORDER — INFLUENZA VIRUS VACC SPLIT PF IM SUSP: 0.5000 mL | Freq: Once | INTRAMUSCULAR | Status: DC

## 2013-02-18 NOTE — Patient Instructions (Addendum)
Braxton Hicks Contractions Pregnancy is commonly associated with contractions of the uterus throughout the pregnancy. Towards the end of pregnancy (32 to 34 weeks), these contractions [facility] Rough) can develop more often and may become more forceful. This is not true labor because these contractions do not result in opening (dilatation) and thinning of the cervix. They are sometimes difficult to tell apart from true labor because these contractions can be forceful and people have different pain tolerances. You should not feel embarrassed if you go to the hospital with false labor. Sometimes, the only way to tell if you are in true labor is for your caregiver to follow the changes in the cervix. How to tell the difference between true and false labor:  False labor.  The contractions of false labor are usually shorter, irregular and not as hard as those of true labor.  They are often felt in the front of the lower abdomen and in the groin.  They may leave with walking around or changing positions while lying down.  They get weaker and are shorter lasting as time goes on.  These contractions are usually irregular.  They do not usually become progressively stronger, regular and closer together as with true labor.  True labor.  Contractions in true labor last 30 to 70 seconds, become very regular, usually become more intense, and increase in frequency.  They do not go away with walking.  The discomfort is usually felt in the top of the uterus and spreads to the lower abdomen and low back.  True labor can be determined by your caregiver with an exam. This will show that the cervix is dilating and getting thinner. If there are no prenatal problems or other health problems associated with the pregnancy, it is completely safe to be sent home with false labor and await the onset of true labor. HOME CARE INSTRUCTIONS   Keep up with your usual exercises and instructions.  Take medications as  directed.  Keep your regular prenatal appointment.  Eat and drink lightly if you think you are going into labor.  If BH contractions are making you uncomfortable:  Change your activity position from lying down or resting to walking/walking to resting.  Sit and rest in a tub of warm water.  Drink 2 to 3 glasses of water. Dehydration may cause B-H contractions.  Do slow and deep breathing several times an hour. SEEK IMMEDIATE MEDICAL CARE IF:   Your contractions continue to become stronger, more regular, and closer together.  You have a gushing, burst or leaking of fluid from the vagina.  An oral temperature above 102 F (38.9 C) develops.  You have passage of blood-tinged mucus.  You develop vaginal bleeding.  You develop continuous belly (abdominal) pain.  You have low back pain that you never had before.  You feel the baby's head pushing down causing pelvic pressure.  The baby is not moving as much as it used to. Document Released: 05/13/2005 Document Revised: 08/05/2011 Document Reviewed: 11/04/2008 [facility] Patient Information 2014 [place], [place].  Pregnancy - Third Trimester The third trimester of pregnancy (the last 3 months) is a period of the most rapid growth for you and your baby. The baby approaches a length of 20 inches and a weight of 6 to 10 pounds. The baby is adding on fat and getting ready for life outside your body. While inside, babies have periods of sleeping and waking, sucking thumbs, and hiccuping. You can often feel small contractions of the uterus. This  is false labor. It is also called Braxton-Hicks contractions. This is like a practice for labor. The usual problems in this stage of pregnancy include more difficulty breathing, swelling of the hands and feet from water retention, and having to urinate more often because of the uterus and baby pressing on your bladder.  PRENATAL EXAMS  Blood work may continue to be done during prenatal exams. These  tests are done to check on your health and the probable health of your baby. Blood work is used to follow your blood levels (hemoglobin). Anemia (low hemoglobin) is common during pregnancy. Iron and vitamins are given to help prevent this. You may also continue to be checked for diabetes. Some of the past blood tests may be done again.  The size of the uterus is measured during each visit. This makes sure your baby is growing properly according to your pregnancy dates.  Your blood pressure is checked every prenatal visit. This is to make sure you are not getting toxemia.  Your urine is checked every prenatal visit for infection, diabetes, and protein.  Your weight is checked at each visit. This is done to make sure gains are happening at the suggested rate and that you and your baby are growing normally.  Sometimes, an ultrasound is performed to confirm the position and the proper growth and development of the baby. This is a test done that bounces harmless sound waves off the baby so your caregiver can more accurately determine a due date.  Discuss the type of pain medicine and anesthesia you will have during your labor and delivery.  Discuss the possibility and anesthesia if a cesarean section might be necessary.  Inform your caregiver if there is any mental or physical violence at home. Sometimes, a specialized non-stress test, contraction stress test, and biophysical profile are done to make sure the baby is not having a problem. Checking the amniotic fluid surrounding the baby is called an amniocentesis. The amniotic fluid is removed by sticking a needle into the belly (abdomen). This is sometimes done near the end of pregnancy if an early delivery is required. In this case, it is done to help make sure the baby's lungs are mature enough for the baby to live outside of the womb. If the lungs are not mature and it is unsafe to deliver the baby, an injection of cortisone medicine is given to the  mother 1 to 2 days before the delivery. This helps the baby's lungs mature and makes it safer to deliver the baby. CHANGES OCCURING IN THE THIRD TRIMESTER OF PREGNANCY Your body goes through many changes during pregnancy. They vary from person to person. Talk to your caregiver about changes you notice and are concerned about.  During the last trimester, you have probably had an increase in your appetite. It is normal to have cravings for certain foods. This varies from person to person and pregnancy to pregnancy.  You may begin to get stretch marks on your hips, abdomen, and breasts. These are normal changes in the body during pregnancy. There are no exercises or medicines to take which prevent this change.  Constipation may be treated with a stool softener or adding bulk to your diet. Drinking lots of fluids, fiber in vegetables, fruits, and whole grains are helpful.  Exercising is also helpful. If you have been very active up until your pregnancy, most of these activities can be continued during your pregnancy. If you have been less active, it is helpful to start  an exercise program such as walking. Consult your caregiver before starting exercise programs.  Avoid all smoking, alcohol, non-prescribed drugs, herbs and "street drugs" during your pregnancy. These chemicals affect the formation and growth of the baby. Avoid chemicals throughout the pregnancy to ensure the delivery of a healthy infant.  Backache, varicose veins, and hemorrhoids may develop or get worse.  You will tire more easily in the third trimester, which is normal.  The baby's movements may be stronger and more often.  You may become short of breath easily.  Your belly button may stick out.  A yellow discharge may leak from your breasts called colostrum.  You may have a bloody mucus discharge. This usually occurs a few days to a week before labor begins. HOME CARE INSTRUCTIONS   Keep your caregiver's appointments.  Follow your caregiver's instructions regarding medicine use, exercise, and diet.  During pregnancy, you are providing food for you and your baby. Continue to eat regular, well-balanced meals. Choose foods such as meat, fish, milk and other low fat dairy products, vegetables, fruits, and whole-grain breads and cereals. Your caregiver will tell you of the ideal weight gain.  A physical sexual relationship may be continued throughout pregnancy if there are no other problems such as early (premature) leaking of amniotic fluid from the membranes, vaginal bleeding, or belly (abdominal) pain.  Exercise regularly if there are no restrictions. Check with your caregiver if you are unsure of the safety of your exercises. Greater weight gain will occur in the last 2 trimesters of pregnancy. Exercising helps:  Control your weight.  Get you in shape for labor and delivery.  You lose weight after you deliver.  Rest a lot with legs elevated, or as needed for leg cramps or low back pain.  Wear a good support or jogging bra for breast tenderness during pregnancy. This may help if worn during sleep. Pads or tissues may be used in the bra if you are leaking colostrum.  Do not use hot tubs, steam rooms, or saunas.  Wear your seat belt when driving. This protects you and your baby if you are in an accident.  Avoid raw meat, cat litter boxes and soil used by cats. These carry germs that can cause birth defects in the baby.  It is easier to leak urine during pregnancy. Tightening up and strengthening the pelvic muscles will help with this problem. You can practice stopping your urination while you are going to the bathroom. These are the same muscles you need to strengthen. It is also the muscles you would use if you were trying to stop from passing gas. You can practice tightening these muscles up 10 times a set and repeating this about 3 times per day. Once you know what muscles to tighten up, do not perform these  exercises during urination. It is more likely to cause an infection by backing up the urine.  Ask for help if you have financial, counseling, or nutritional needs during pregnancy. Your caregiver will be able to offer counseling for these needs as well as refer you for other special needs.  Make a list of emergency phone numbers and have them available.  Plan on getting help from family or friends when you go home from the hospital.  Make a trial run to the hospital.  Take prenatal classes with the father to understand, practice, and ask questions about the labor and delivery.  Prepare the baby's room or nursery.  Do not travel out of the  city unless it is absolutely necessary and with the advice of your caregiver.  Wear only low or no heal shoes to have better balance and prevent falling. MEDICINES AND DRUG USE IN PREGNANCY  Take prenatal vitamins as directed. The vitamin should contain 1 milligram of folic acid. Keep all vitamins out of reach of children. Only a couple vitamins or tablets containing iron may be fatal to a baby or young child when ingested.  Avoid use of all medicines, including herbs, over-the-counter medicines, not prescribed or suggested by your caregiver. Only take over-the-counter or prescription medicines for pain, discomfort, or fever as directed by your caregiver. Do not use aspirin, ibuprofen or naproxen unless approved by your caregiver.  Let your caregiver also know about herbs you may be using.  Alcohol is related to a number of birth defects. This includes fetal alcohol syndrome. All alcohol, in any form, should be avoided completely. Smoking will cause low birth rate and premature babies.  Illegal drugs are very harmful to the baby. They are absolutely forbidden. A baby born to an addicted mother will be addicted at birth. The baby will go through the same withdrawal an adult does. SEEK MEDICAL CARE IF: You have any concerns or worries during your  pregnancy. It is better to call with your questions if you feel they cannot wait, rather than worry about them. SEEK IMMEDIATE MEDICAL CARE IF:   An unexplained oral temperature above 102 F (38.9 C) develops, or as your caregiver suggests.  You have leaking of fluid from the vagina. If leaking membranes are suspected, take your temperature and tell your caregiver of this when you call.  There is vaginal spotting, bleeding or passing clots. Tell your caregiver of the amount and how many pads are used.  You develop a bad smelling vaginal discharge with a change in the color from clear to white.  You develop vomiting that lasts more than 24 hours.  You develop chills or fever.  You develop shortness of breath.  You develop burning on urination.  You loose more than 2 pounds of weight or gain more than 2 pounds of weight or as suggested by your caregiver.  You notice sudden swelling of your face, hands, and feet or legs.  You develop belly (abdominal) pain. Round ligament discomfort is a common non-cancerous (benign) cause of abdominal pain in pregnancy. Your caregiver still must evaluate you.  You develop a severe headache that does not go away.  You develop visual problems, blurred or double vision.  If you have not felt your baby move for more than 1 hour. If you think the baby is not moving as much as usual, eat something with sugar in it and lie down on your left side for an hour. The baby should move at least 4 to 5 times per hour. Call right away if your baby moves less than that.  You fall, are in a car accident, or any kind of trauma.  There is mental or physical violence at home. Document Released: 05/07/2001 Document Revised: 02/05/2012 Document Reviewed: 11/09/2008 [facility] Patient Information 2014 [place], [place].  Breastfeeding A change in hormones during your pregnancy causes growth of your breast tissue and an increase in number and size of milk ducts. The hormone  prolactin allows proteins, sugars, and fats from your blood supply to make breast milk in your milk-producing glands. The hormone progesterone prevents breast milk from being released before the birth of your baby. After the birth of your baby,  your progesterone level decreases allowing breast milk to be released. Thoughts of your baby, as well as his or her sucking or crying, can stimulate the release of milk from the milk-producing glands. Deciding to breastfeed (nurse) is one of the best choices you can make for you and your baby. The information that follows gives a brief review of the benefits, as well as other important skills to know about breastfeeding. BENEFITS OF BREASTFEEDING For your baby  The first milk (colostrum) helps your baby's digestive system function better.   There are antibodies in your milk that help your baby fight off infections.   Your baby has a lower incidence of asthma, allergies, and sudden infant death syndrome (SIDS).   The nutrients in breast milk are better for your baby than infant formulas.  Breast milk improves your baby's brain development.   Your baby will have less gas, colic, and constipation.  Your baby is less likely to develop other conditions, such as childhood obesity, asthma, or diabetes mellitus. For you  Breastfeeding helps develop a very special bond between you and your baby.   Breastfeeding is convenient, always available at the correct temperature, and costs nothing.   Breastfeeding helps to burn calories and helps you lose the weight gained during pregnancy.   Breastfeeding makes your uterus contract back down to normal size faster and slows bleeding following delivery.   Breastfeeding mothers have a lower risk of developing osteoporosis or breast or ovarian cancer later in life.  BREASTFEEDING FREQUENCY  A healthy, full-term baby may breastfeed as often as every hour or space his or her feedings to every 3 hours.  Breastfeeding frequency will vary from baby to baby.   Newborns should be fed no less than every 2 3 hours during the day and every 4 5 hours during the night. You should breastfeed a minimum of 8 feedings in a 24 hour period.  Awaken your baby to breastfeed if it has been 3 4 hours since the last feeding.  Breastfeed when you feel the need to reduce the fullness of your breasts or when your newborn shows signs of hunger. Signs that your baby may be hungry include:  Increased alertness or activity.  Stretching.  Movement of the head from side to side.  Movement of the head and opening of the mouth when the corner of the mouth or cheek is stroked (rooting).  Increased sucking sounds, smacking lips, cooing, sighing, or squeaking.  Hand-to-mouth movements.  Increased sucking of fingers or hands.  Fussing.  Intermittent crying.  Signs of extreme hunger will require calming and consoling before you try to feed your baby. Signs of extreme hunger may include:  Restlessness.  A loud, strong cry.  Screaming.  Frequent feeding will help you make more milk and will help prevent problems, such as sore nipples and engorgement of the breasts.  BREASTFEEDING   Whether lying down or sitting, be sure that the baby's abdomen is facing your abdomen.   Support your breast with 4 fingers under your breast and your thumb above your nipple. Make sure your fingers are well away from your nipple and your baby's mouth.   Stroke your baby's lips gently with your finger or nipple.   When your baby's mouth is open wide enough, place all of your nipple and as much of the colored area around your nipple (areola) as possible into your baby's mouth.  More areola should be visible above his or her upper lip than below his  or her lower lip.  Your baby's tongue should be between his or her lower gum and your breast.  Ensure that your baby's mouth is correctly positioned around the nipple (latched).  Your baby's lips should create a seal on your breast.  Signs that your baby has effectively latched onto your nipple include:  Tugging or sucking without pain.  Swallowing heard between sucks.  Absent click or smacking sound.  Muscle movement above and in front of his or her ears with sucking.  Your baby must suck about 2 3 minutes in order to get your milk. Allow your baby to feed on each breast as long as he or she wants. Nurse your baby until he or she unlatches or falls asleep at the first breast, then offer the second breast.  Signs that your baby is full and satisfied include:  A gradual decrease in the number of sucks or complete cessation of sucking.  Falling asleep.  Extension or relaxation of his or her body.  Retention of a small amount of milk in his or her mouth.  Letting go of your breast by himself or herself.  Signs of effective breastfeeding in you include:  Breasts that have increased firmness, weight, and size prior to feeding.  Breasts that are softer after nursing.  Increased milk volume, as well as a change in milk consistency and color by the 5th day of breastfeeding.  Breast fullness relieved by breastfeeding.  Nipples are not sore, cracked, or bleeding.  If needed, break the suction by putting your finger into the corner of your baby's mouth and sliding your finger between his or her gums. Then, remove your breast from his or her mouth.  It is common for babies to spit up a small amount after a feeding.  Babies often swallow air during feeding. This can make babies fussy. Burping your baby between breasts can help with this.  Vitamin D supplements are recommended for babies who get only breast milk.  Avoid using a pacifier during your baby's first 4 6 weeks.  Avoid supplemental feedings of water, formula, or juice in place of breastfeeding. Breast milk is all the food your baby needs. It is not necessary for your baby to have water or formula.  Your breasts will make more milk if supplemental feedings are avoided during the early weeks. HOW TO TELL WHETHER YOUR BABY IS GETTING ENOUGH BREAST MILK Wondering whether or not your baby is getting enough milk is a common concern among mothers. You can be assured that your baby is getting enough milk if:   Your baby is actively sucking and you hear swallowing.   Your baby seems relaxed and satisfied after a feeding.   Your baby nurses at least 8 12 times in a 24 hour time period.  During the first 80 72 days of age:  Your baby is wetting at least 3 5 diapers in a 24 hour period. The urine should be clear and pale yellow.  Your baby is having at least 3 4 stools in a 24 hour period. The stool should be soft and yellow.  At 67 60 days of age, your baby is having at least 3 6 stools in a 24 hour period. The stool should be seedy and yellow by 73 days of age.  Your baby has a weight loss less than 7 10% during the first 82 days of age.  Your baby does not lose weight after 20 53 days of age.  Your baby  gains 4 7 ounces each week after he or she is 54 days of age.  Your baby gains weight by 51 days of age and is back to birth weight within 2 weeks. ENGORGEMENT In the first week after your baby is born, you may experience extremely full breasts (engorgement). When engorged, your breasts may feel heavy, warm, or tender to the touch. Engorgement peaks within 24 48 hours after delivery of your baby.  Engorgement may be reduced by:  Continuing to breastfeed.  Increasing the frequency of breastfeeding.  Taking warm showers or applying warm, moist heat to your breasts just before each feeding. This increases circulation and helps the milk flow.   Gently massaging your breast before and during the feedings. With your fingertips, massage from your chest wall towards your nipple in a circular motion.   Ensuring that your baby empties at least one breast at every feeding. It also helps to start the  next feeding on the opposite breast.   Expressing breast milk by hand or by using a breast pump to empty the breasts if your baby is sleepy, or not nursing well. You may also want to express milk if you are returning to work oryou feel you are getting engorged.  Ensuring your baby is latched on and positioned properly while breastfeeding. If you follow these suggestions, your engorgement should improve in 24 48 hours. If you are still experiencing difficulty, call your lactation consultant or caregiver.  CARING FOR YOURSELF Take care of your breasts.  Bathe or shower daily.   Avoid using soap on your nipples.   Wear a supportive bra. Avoid wearing underwire style bras.  Air dry your nipples for a 3 after each feeding.   Use only cotton bra pads to absorb breast milk leakage. Leaking of breast milk between feedings is normal.   Use only pure lanolin on your nipples after nursing. You do not need to wash it off before feeding your baby again. Another option is to express a few drops of breast milk and gently massage that milk into your nipples.  Continue breast self-awareness checks. Take care of yourself.  Eat healthy foods. Alternate 3 meals with 3 snacks.  Avoid foods that you notice affect your baby in a bad way.  Drink milk, fruit juice, and water to satisfy your thirst (about 8 glasses a day).   Rest often, relax, and take your prenatal vitamins to prevent fatigue, stress, and anemia.  Avoid chewing and smoking tobacco.  Avoid alcohol and drug use.  Take over-the-counter and prescribed medicine only as directed by your caregiver or pharmacist. You should always check with your caregiver or pharmacist before taking any new medicine, vitamin, or herbal supplement.  Know that pregnancy is possible while breastfeeding. If desired, talk to your caregiver about family planning and safe birth control methods that may be used while breastfeeding. SEEK MEDICAL CARE  IF:   You feel like you want to stop breastfeeding or have become frustrated with breastfeeding.  You have painful breasts or nipples.  Your nipples are cracked or bleeding.  Your breasts are red, tender, or warm.  You have a swollen area on either breast.  You have a fever or chills.  You have nausea or vomiting.  You have drainage from your nipples.  Your breasts do not become full before feedings by the 5th day after delivery.  You feel sad and depressed.  Your baby is too sleepy to eat well.  Your baby is  having trouble sleeping.   Your baby is wetting less than 3 diapers in a 24 hour period.  Your baby has less than 3 stools in a 24 hour period.  Your baby's skin or the white part of his or her eyes becomes more yellow.   Your baby is not gaining weight by 86 days of age. MAKE SURE YOU:   Understand these instructions.  Will watch your condition.  Will get help right away if you are not doing well or get worse. Document Released: 05/13/2005 Document Revised: 02/05/2012 Document Reviewed: 12/18/2011 [facility] Patient Information 2014 [place], [place].

## 2013-02-18 NOTE — Progress Notes (Signed)
P-89 

## 2013-02-18 NOTE — Progress Notes (Signed)
ECHO was normal, no clear prolapse-mild regurgitation. Labor precautions.

## 2013-02-19 ENCOUNTER — Encounter: Payer: BC Managed Care – PPO | Admitting: Obstetrics and Gynecology

## 2013-02-25 ENCOUNTER — Ambulatory Visit (INDEPENDENT_AMBULATORY_CARE_PROVIDER_SITE_OTHER): Payer: BC Managed Care – PPO | Admitting: Obstetrics & Gynecology

## 2013-02-25 VITALS — BP 122/73 | Wt 145.0 lb

## 2013-02-25 DIAGNOSIS — Z3493 Encounter for supervision of normal pregnancy, unspecified, third trimester: Secondary | ICD-10-CM

## 2013-02-25 DIAGNOSIS — Z3480 Encounter for supervision of other normal pregnancy, unspecified trimester: Secondary | ICD-10-CM

## 2013-02-25 LAB — OB RESULTS CONSOLE GBS: GBS: NEGATIVE

## 2013-02-25 NOTE — Progress Notes (Signed)
Pelvic cultures done today.  No other complaints or concerns.  Fetal movement and labor precautions reviewed. 

## 2013-02-25 NOTE — Progress Notes (Signed)
P - 91 - Pt needs GBS and GC/Chlam testing today

## 2013-02-25 NOTE — Patient Instructions (Signed)
Return to clinic for any obstetric concerns or go to [facility] for evaluation  

## 2013-02-26 LAB — GC/CHLAMYDIA PROBE AMP
CT Probe RNA: NEGATIVE
GC Probe RNA: NEGATIVE

## 2013-02-28 LAB — CULTURE, BETA STREP (GROUP B ONLY)

## 2013-03-01 ENCOUNTER — Encounter: Payer: Self-pay | Admitting: Obstetrics & Gynecology

## 2013-03-05 ENCOUNTER — Encounter: Payer: Self-pay | Admitting: Obstetrics & Gynecology

## 2013-03-05 ENCOUNTER — Ambulatory Visit (INDEPENDENT_AMBULATORY_CARE_PROVIDER_SITE_OTHER): Payer: BC Managed Care – PPO | Admitting: Obstetrics & Gynecology

## 2013-03-05 VITALS — BP 113/63 | Wt 147.0 lb

## 2013-03-05 DIAGNOSIS — Z3480 Encounter for supervision of other normal pregnancy, unspecified trimester: Secondary | ICD-10-CM

## 2013-03-05 DIAGNOSIS — Z3493 Encounter for supervision of normal pregnancy, unspecified, third trimester: Secondary | ICD-10-CM

## 2013-03-05 NOTE — Progress Notes (Signed)
No complaints or concerns.  Fetal movement and labor precautions reviewed. 

## 2013-03-05 NOTE — Patient Instructions (Signed)
Return to clinic for any obstetric concerns or go to [facility] for evaluation  

## 2013-03-05 NOTE — Progress Notes (Signed)
P-103  Patient is doing well with no concerns.

## 2013-03-11 ENCOUNTER — Ambulatory Visit (INDEPENDENT_AMBULATORY_CARE_PROVIDER_SITE_OTHER): Payer: BC Managed Care – PPO | Admitting: Obstetrics and Gynecology

## 2013-03-11 ENCOUNTER — Encounter: Payer: Self-pay | Admitting: Obstetrics and Gynecology

## 2013-03-11 VITALS — BP 97/72 | Wt 150.0 lb

## 2013-03-11 DIAGNOSIS — I341 Nonrheumatic mitral (valve) prolapse: Secondary | ICD-10-CM

## 2013-03-11 DIAGNOSIS — Z3480 Encounter for supervision of other normal pregnancy, unspecified trimester: Secondary | ICD-10-CM

## 2013-03-11 DIAGNOSIS — Z3493 Encounter for supervision of normal pregnancy, unspecified, third trimester: Secondary | ICD-10-CM

## 2013-03-11 DIAGNOSIS — I059 Rheumatic mitral valve disease, unspecified: Secondary | ICD-10-CM

## 2013-03-11 NOTE — Progress Notes (Signed)
Patient doing well without complaints. FM/labor precautions reviewed 

## 2013-03-11 NOTE — Progress Notes (Signed)
P-89 

## 2013-03-18 ENCOUNTER — Ambulatory Visit (INDEPENDENT_AMBULATORY_CARE_PROVIDER_SITE_OTHER): Payer: BC Managed Care – PPO | Admitting: Obstetrics and Gynecology

## 2013-03-18 ENCOUNTER — Encounter: Payer: Self-pay | Admitting: Obstetrics and Gynecology

## 2013-03-18 VITALS — BP 112/69 | Wt 152.0 lb

## 2013-03-18 DIAGNOSIS — Z3493 Encounter for supervision of normal pregnancy, unspecified, third trimester: Secondary | ICD-10-CM

## 2013-03-18 DIAGNOSIS — Z3480 Encounter for supervision of other normal pregnancy, unspecified trimester: Secondary | ICD-10-CM

## 2013-03-18 DIAGNOSIS — I341 Nonrheumatic mitral (valve) prolapse: Secondary | ICD-10-CM

## 2013-03-18 DIAGNOSIS — I059 Rheumatic mitral valve disease, unspecified: Secondary | ICD-10-CM

## 2013-03-18 NOTE — Progress Notes (Signed)
P - 81 

## 2013-03-18 NOTE — Progress Notes (Signed)
Patient doing well without concerns/complaints. FM/labor precautions reviewed. Cervical exam unchanged. Plan for postdate fetal testing next week

## 2013-03-24 ENCOUNTER — Encounter: Payer: Self-pay | Admitting: Obstetrics & Gynecology

## 2013-03-24 ENCOUNTER — Ambulatory Visit (INDEPENDENT_AMBULATORY_CARE_PROVIDER_SITE_OTHER): Payer: BC Managed Care – PPO | Admitting: Obstetrics & Gynecology

## 2013-03-24 VITALS — BP 121/77 | Wt 156.0 lb

## 2013-03-24 DIAGNOSIS — Z3490 Encounter for supervision of normal pregnancy, unspecified, unspecified trimester: Secondary | ICD-10-CM

## 2013-03-24 DIAGNOSIS — Z3480 Encounter for supervision of other normal pregnancy, unspecified trimester: Secondary | ICD-10-CM

## 2013-03-24 NOTE — Progress Notes (Signed)
P= 80 

## 2013-03-24 NOTE — Progress Notes (Signed)
Routine visit. Good FM. NST reactive. Labor precautions reviewed. No problems. She will be scheduled for IOL at 7:30 am on 03-31-13.

## 2013-03-25 ENCOUNTER — Encounter (HOSPITAL_COMMUNITY): Payer: Self-pay | Admitting: *Deleted

## 2013-03-25 ENCOUNTER — Telehealth (HOSPITAL_COMMUNITY): Payer: Self-pay | Admitting: *Deleted

## 2013-03-25 NOTE — Telephone Encounter (Signed)
Preadmission screen  

## 2013-03-26 ENCOUNTER — Encounter (HOSPITAL_COMMUNITY): Payer: BC Managed Care – PPO | Admitting: Anesthesiology

## 2013-03-26 ENCOUNTER — Inpatient Hospital Stay (HOSPITAL_COMMUNITY)
Admission: AD | Admit: 2013-03-26 | Discharge: 2013-03-28 | DRG: 774 | Disposition: A | Discharge status: Home / Self Care | Payer: BC Managed Care – PPO | Source: Ambulatory Visit | Attending: Family Medicine | Admitting: Family Medicine

## 2013-03-26 ENCOUNTER — Inpatient Hospital Stay (HOSPITAL_COMMUNITY): Payer: BC Managed Care – PPO | Admitting: Anesthesiology

## 2013-03-26 ENCOUNTER — Encounter (HOSPITAL_COMMUNITY): Payer: Self-pay | Admitting: *Deleted

## 2013-03-26 DIAGNOSIS — I2510 Atherosclerotic heart disease of native coronary artery without angina pectoris: Secondary | ICD-10-CM | POA: Diagnosis present

## 2013-03-26 DIAGNOSIS — I059 Rheumatic mitral valve disease, unspecified: Secondary | ICD-10-CM

## 2013-03-26 DIAGNOSIS — O9942 Diseases of the circulatory system complicating childbirth: Secondary | ICD-10-CM

## 2013-03-26 DIAGNOSIS — Z3493 Encounter for supervision of normal pregnancy, unspecified, third trimester: Secondary | ICD-10-CM

## 2013-03-26 HISTORY — DX: Other specified health status: Z78.9

## 2013-03-26 LAB — CBC
HCT: 32.5 % — ABNORMAL LOW (ref 36.0–46.0)
Hemoglobin: 10.8 g/dL — ABNORMAL LOW (ref 12.0–15.0)
MCH: 28.3 pg (ref 26.0–34.0)
MCHC: 33.2 g/dL (ref 30.0–36.0)
MCV: 85.3 fL (ref 78.0–100.0)
Platelets: 161 10*3/uL (ref 150–400)
RBC: 3.81 MIL/uL — ABNORMAL LOW (ref 3.87–5.11)
RDW: 13.1 % (ref 11.5–15.5)
WBC: 7.7 10*3/uL (ref 4.0–10.5)

## 2013-03-26 LAB — POCT FERN TEST: POCT Fern Test: POSITIVE

## 2013-03-26 LAB — RPR: RPR Ser Ql: NONREACTIVE

## 2013-03-26 MED ORDER — PHENYLEPHRINE 40 MCG/ML (10ML) SYRINGE FOR IV PUSH (FOR BLOOD PRESSURE SUPPORT)
80.0000 ug | PREFILLED_SYRINGE | INTRAVENOUS | Status: DC | PRN
  Filled 2013-03-26: qty 10
  Filled 2013-03-26: qty 2

## 2013-03-26 MED ORDER — DIPHENHYDRAMINE HCL 50 MG/ML IJ SOLN
12.5000 mg | INTRAMUSCULAR | Status: DC | PRN
Start: 2013-03-26 — End: 2013-03-26

## 2013-03-26 MED ORDER — PRENATAL MULTIVITAMIN CH
1.0000 | ORAL_TABLET | Freq: Every day | ORAL | Status: DC
  Administered 2013-03-27 – 2013-03-28 (×2): 1 via ORAL
  Filled 2013-03-26 (×2): qty 1

## 2013-03-26 MED ORDER — FENTANYL CITRATE 0.05 MG/ML IJ SOLN
100.0000 ug | Freq: Once | INTRAMUSCULAR | Status: AC
  Administered 2013-03-26: 100 ug via INTRAVENOUS
  Filled 2013-03-26: qty 2

## 2013-03-26 MED ORDER — OXYCODONE-ACETAMINOPHEN 5-325 MG PO TABS: 1.0000 | ORAL_TABLET | ORAL | Status: DC | PRN

## 2013-03-26 MED ORDER — IBUPROFEN 600 MG PO TABS
600.0000 mg | ORAL_TABLET | Freq: Four times a day (QID) | ORAL | Status: DC
  Administered 2013-03-27 – 2013-03-28 (×7): 600 mg via ORAL
  Filled 2013-03-26 (×7): qty 1

## 2013-03-26 MED ORDER — BENZOCAINE-MENTHOL 20-0.5 % EX AERO
1.0000 "application " | INHALATION_SPRAY | CUTANEOUS | Status: DC | PRN
  Administered 2013-03-27 – 2013-03-28 (×2): 1 via TOPICAL
  Filled 2013-03-26 (×2): qty 56

## 2013-03-26 MED ORDER — OXYTOCIN 40 UNITS IN LACTATED RINGERS INFUSION - SIMPLE MED
62.5000 mL/h | INTRAVENOUS | Status: DC
  Filled 2013-03-26: qty 1000

## 2013-03-26 MED ORDER — ACETAMINOPHEN 325 MG PO TABS: 650.0000 mg | ORAL_TABLET | ORAL | Status: DC | PRN

## 2013-03-26 MED ORDER — OXYCODONE-ACETAMINOPHEN 5-325 MG PO TABS
1.0000 | ORAL_TABLET | ORAL | Status: DC | PRN
  Administered 2013-03-28 (×2): 1 via ORAL
  Filled 2013-03-26 (×2): qty 1

## 2013-03-26 MED ORDER — FENTANYL 2.5 MCG/ML BUPIVACAINE 1/10 % EPIDURAL INFUSION (WH - ANES)
14.0000 mL/h | INTRAMUSCULAR | Status: DC | PRN
  Administered 2013-03-26 (×2): 14 mL/h via EPIDURAL
  Filled 2013-03-26 (×3): qty 125

## 2013-03-26 MED ORDER — ONDANSETRON HCL 4 MG/2ML IJ SOLN: 4.0000 mg | Freq: Four times a day (QID) | INTRAMUSCULAR | Status: DC | PRN

## 2013-03-26 MED ORDER — DIPHENHYDRAMINE HCL 25 MG PO CAPS: 25.0000 mg | ORAL_CAPSULE | Freq: Four times a day (QID) | ORAL | Status: DC | PRN

## 2013-03-26 MED ORDER — SIMETHICONE 80 MG PO CHEW: 80.0000 mg | CHEWABLE_TABLET | ORAL | Status: DC | PRN

## 2013-03-26 MED ORDER — LANOLIN HYDROUS EX OINT: TOPICAL_OINTMENT | CUTANEOUS | Status: DC | PRN

## 2013-03-26 MED ORDER — SODIUM BICARBONATE 8.4 % IV SOLN
INTRAVENOUS | Status: DC | PRN
  Administered 2013-03-26: 5 mL via EPIDURAL

## 2013-03-26 MED ORDER — OXYTOCIN 40 UNITS IN LACTATED RINGERS INFUSION - SIMPLE MED
1.0000 m[IU]/min | INTRAVENOUS | Status: DC
  Administered 2013-03-26: 2 m[IU]/min via INTRAVENOUS

## 2013-03-26 MED ORDER — LACTATED RINGERS IV SOLN: 500.0000 mL | INTRAVENOUS | Status: DC | PRN

## 2013-03-26 MED ORDER — DIBUCAINE 1 % RE OINT: 1.0000 "application " | TOPICAL_OINTMENT | RECTAL | Status: DC | PRN

## 2013-03-26 MED ORDER — EPHEDRINE 5 MG/ML INJ
10.0000 mg | INTRAVENOUS | Status: DC | PRN
  Filled 2013-03-26: qty 2

## 2013-03-26 MED ORDER — TERBUTALINE SULFATE 1 MG/ML IJ SOLN: 0.2500 mg | Freq: Once | INTRAMUSCULAR | Status: DC | PRN

## 2013-03-26 MED ORDER — ONDANSETRON HCL 4 MG PO TABS: 4.0000 mg | ORAL_TABLET | ORAL | Status: DC | PRN

## 2013-03-26 MED ORDER — CITRIC ACID-SODIUM CITRATE 334-500 MG/5ML PO SOLN: 30.0000 mL | ORAL | Status: DC | PRN

## 2013-03-26 MED ORDER — ZOLPIDEM TARTRATE 5 MG PO TABS: 5.0000 mg | ORAL_TABLET | Freq: Every evening | ORAL | Status: DC | PRN

## 2013-03-26 MED ORDER — LIDOCAINE HCL (PF) 1 % IJ SOLN
30.0000 mL | INTRAMUSCULAR | Status: DC | PRN
  Administered 2013-03-26: 30 mL via SUBCUTANEOUS
  Filled 2013-03-26 (×2): qty 30

## 2013-03-26 MED ORDER — LACTATED RINGERS IV SOLN
500.0000 mL | Freq: Once | INTRAVENOUS | Status: AC
  Administered 2013-03-26: 500 mL via INTRAVENOUS

## 2013-03-26 MED ORDER — ONDANSETRON HCL 4 MG/2ML IJ SOLN: 4.0000 mg | INTRAMUSCULAR | Status: DC | PRN

## 2013-03-26 MED ORDER — OXYTOCIN BOLUS FROM INFUSION
500.0000 mL | INTRAVENOUS | Status: DC
  Administered 2013-03-26: 500 mL via INTRAVENOUS

## 2013-03-26 MED ORDER — SENNOSIDES-DOCUSATE SODIUM 8.6-50 MG PO TABS
2.0000 | ORAL_TABLET | ORAL | Status: DC
  Administered 2013-03-27 – 2013-03-28 (×2): 2 via ORAL
  Filled 2013-03-26 (×2): qty 2

## 2013-03-26 MED ORDER — EPHEDRINE 5 MG/ML INJ
10.0000 mg | INTRAVENOUS | Status: DC | PRN
  Filled 2013-03-26: qty 4
  Filled 2013-03-26: qty 2

## 2013-03-26 MED ORDER — PHENYLEPHRINE 40 MCG/ML (10ML) SYRINGE FOR IV PUSH (FOR BLOOD PRESSURE SUPPORT)
80.0000 ug | PREFILLED_SYRINGE | INTRAVENOUS | Status: DC | PRN
  Filled 2013-03-26: qty 2

## 2013-03-26 MED ORDER — LACTATED RINGERS IV SOLN
INTRAVENOUS | Status: DC
  Administered 2013-03-26 (×2): via INTRAVENOUS

## 2013-03-26 MED ORDER — IBUPROFEN 600 MG PO TABS
600.0000 mg | ORAL_TABLET | Freq: Four times a day (QID) | ORAL | Status: DC | PRN
  Administered 2013-03-26: 600 mg via ORAL
  Filled 2013-03-26: qty 1

## 2013-03-26 MED ORDER — TETANUS-DIPHTH-ACELL PERTUSSIS 5-2.5-18.5 LF-MCG/0.5 IM SUSP: 0.5000 mL | Freq: Once | INTRAMUSCULAR | Status: DC

## 2013-03-26 MED ORDER — WITCH HAZEL-GLYCERIN EX PADS
1.0000 "application " | MEDICATED_PAD | CUTANEOUS | Status: DC | PRN
  Administered 2013-03-28: 1 via TOPICAL

## 2013-03-26 NOTE — MAU Note (Signed)
Pt reports ROM @ 0230. UC's stronger since leaking started.

## 2013-03-26 NOTE — Anesthesia Procedure Notes (Signed)

## 2013-03-26 NOTE — Progress Notes (Addendum)
Gabriela Bradford is a 27 y.o. G1P0 at [redacted]w[redacted]d admitted for rupture of membranes  Subjective: Comfortable without distress. Initially evaluated at 12, delay in witting note. Pt now pushing  Objective: BP 140/86  Pulse 102  Temp(Src) 99.7 F (37.6 C) (Oral)  Resp 20  Ht 5\' 10"  (1.778 m)  Wt 156 lb (70.761 kg)  BMI 22.38 kg/m2  SpO2 100%  LMP 06/17/2012   Total I/O In: -  Out: 1200 [Urine:1200]  FHT:  FHR: 160s bpm, variability: moderate,  accelerations:  Abscent,  decelerations:  Absent UC:   regular, every 3-4 minutes SVE:   Dilation: 10 Effacement (%): 100 Station: +1;+2 Exam by:: c soliz rn  Labs: Lab Results  Component Value Date   WBC 7.7 03/26/2013   HGB 10.8* 03/26/2013   HCT 32.5* 03/26/2013   MCV 85.3 03/26/2013   PLT 161 03/26/2013    Assessment / Plan: Spontaneous labor, progressing normally  Labor: Progressing normally Preeclampsia:  no signs or symptoms of toxicity Fetal Wellbeing:  Category II Pain Control:  Epidural I/D:  n/a Anticipated MOD:  NSVD  Gabriela Bradford 03/26/2013, 2:49 PM

## 2013-03-26 NOTE — Anesthesia Preprocedure Evaluation (Signed)

## 2013-03-26 NOTE — H&P (Signed)
I spoke with and examined patient and agree with resident's note and plan of care.  Tawana Scale, MD OB Fellow 03/26/2013 8:08 PM

## 2013-03-26 NOTE — H&P (Signed)
Gabriela Bradford is a 27 y.o. female G1P0 with IUP at [redacted]w[redacted]d by LMP consistent with 19w ultrasound presenting for LOF. Pt reports LOF since 2:30. She had a small gush of fluid and has continued leaking clear fluid since that time. She reports contractions every 2-3 mins that are increasing in intensity and frequency. She reports slight bloody show but denies frank vaginal bleeding. She endorses normal FM.   Pt reports an uncomplicated prenatal course. She has a history of mitral valve prolapse however an echo was obtained on 02/05/13 and showed no evidence of prolapse. She has been receiving her prenatal [facility] at [facility]. She had a normal FTS and msAFP. Normal anatomic ultrasound.  Normal 1 hour GTT. She is GBS negative. She plans to breast feed. She is leaning towards pills for postpartum contraception.   Prenatal History/Complications: none  Past Medical History: Past Medical History  Diagnosis Date  . Mitral valve prolapse syndrome     does not have to take antibiotics per patient  . Supervision of normal pregnancy 10/27/2012     Clinic [facility] Genetic Screen  Anatomic [place] Normal Glucose Screen  TDaP vaccine  Flu vaccine  CF Screen  GBS  Baby Food  Contraception  Circumcision     . Medical history non-contributory     Past Surgical History: Past Surgical History  Procedure Laterality Date  . Wisdom tooth extraction    . Tonsillectomy      Obstetrical History: OB History   Grav Para Term Preterm Abortions TAB SAB Ect Mult Living   1               Gynecological History: OB History   Grav Para Term Preterm Abortions TAB SAB Ect Mult Living   1               Social History: History   Social History  . Marital Status: Married    Spouse Name: N/A    Number of Children: N/A  . Years of Education: N/A   Social History Main Topics  . Smoking status: Former Games developer  . Smokeless tobacco: Never Used  . Alcohol Use: No  . Drug Use: No  . Sexual Activity: Yes   Other Topics  Concern  . None   Social History Narrative  . None    Family History: Family History  Problem Relation Age of Onset  . Adopted: Yes  . Diabetes Neg Hx   . Hypertension Neg Hx     Allergies: No Known Allergies  Facility-administered medications prior to admission  Medication Dose Route Frequency Provider Last Rate Last Dose  . influenza  inactive virus vaccine (FLUZONE/FLUARIX) injection 0.5 mL  0.5 mL Intramuscular Once Reva Bores, MD       Prescriptions prior to admission  Medication Sig Dispense Refill  . Prenatal Vit-Fe Fumarate-FA (MULTIVITAMIN-PRENATAL) 27-0.8 MG TABS Take 1 tablet by mouth daily at 12 noon.         Review of Systems  As per HPI   Blood pressure 126/85, pulse 102, temperature 97.9 F (36.6 C), temperature source Oral, resp. rate 20, last menstrual period 06/17/2012. General appearance: alert and appears stated age, uncomfortable with contractions Lungs: regular rate, no respiratory distress or increased WOB Heart: tachycardic rate and regular rhythm Abdomen: soft, non-tender; bowel sounds normal, gravid, EFW 7.5 lbs by Le[place]olds Pelvic: +obvious LOF per RN, +ferning Extremities: Homans sign is negative, no sign of DVT Presentation: cephalic per Mosca, RN Fetal monitoring: 120 bpm,  moderate variability, + acels, no decels Uterine activity q2 mins   Dilation: 3 Effacement (%): 80 Station: 0 Exam by:: B Mosca   Prenatal labs: ABO, Rh: A/Positive/-- (03/26 0000) Antibody: Negative (03/26 0000) Rubella:   RPR: NON REAC (07/31 1339)  HBsAg: Negative (03/26 0000)  HIV: Non-reactive (03/26 0000)  GBS: Negative (10/02 0000)  1 hr Glucola: 101  Genetic screening: Normal FTS, normal msAFP Anatomy US: normal, incomplete spine views, normal AFI, anterior placenta  Results for orders placed during the hospital encounter of 03/26/13 (from the past 24 hour(s))  POCT FERN TEST   Collection Time    03/26/13  3:35 AM      Result Value Range    POCT Fern Test Positive = ruptured amniotic membanes    CBC   Collection Time    03/26/13  4:10 AM      Result Value Range   WBC 7.7  4.0 - 10.5 K/uL   RBC 3.81 (*) 3.87 - 5.11 MIL/uL   Hemoglobin 10.8 (*) 12.0 - 15.0 g/dL   HCT 16.1 (*) 09.6 - 04.5 %   MCV 85.3  78.0 - 100.0 fL   MCH 28.3  26.0 - 34.0 pg   MCHC 33.2  30.0 - 36.0 g/dL   RDW 40.9  81.1 - 91.4 %   Platelets 161  150 - 400 K/uL    Assessment: Gabriela Bradford is a 27 y.o. G1P0 with an IUP at [redacted]w[redacted]d presenting for ROM and contractions.  Plan: 1. Admit to Labor and Delivery for ROM. Will monitor for spontaneous labor progression and augment as needed.  2. FWB: Cat I  3. GBS negative.  4. Pt requests epidural. Fentanyl IV 100 mcg given now.  5. She plans to breast feed.  6. She is favoring pills for postpartum contraception.     Hal Neer, MD 03/26/2013, 4:07 AM

## 2013-03-26 NOTE — Progress Notes (Signed)
LABOR PROGRESS NOTE  Gabriela Bradford is a 27 y.o. G1P0 at [redacted]w[redacted]d  admitted for rupture of membranes  Subjective: Now comfortable with epidural. No complaints.   Objective: BP 122/68  Pulse 77  Temp(Src) 98.5 F (36.9 C) (Oral)  Resp 18  Ht 5\' 10"  (1.778 m)  Wt 70.761 kg (156 lb)  BMI 22.38 kg/m2  SpO2 100%  LMP 06/17/2012    FHT:  FHR: 130 bpm, variability: moderate,  accelerations:  Present,  decelerations:  Present occasional variable, one prolonged decel at 6:52 UC:   Q3, though not tracing contractions well at this time SVE:   Dilation: 3.5 Effacement (%): 90 Station: 0 Exam by:: e. poore, rn/dr  Gabriela Bradford  Labs: Lab Results  Component Value Date   WBC 7.7 03/26/2013   HGB 10.8* 03/26/2013   HCT 32.5* 03/26/2013   MCV 85.3 03/26/2013   PLT 161 03/26/2013    Assessment / Plan: Admitted with SROM and painful contractions  Labor: Progressing spontaneously at this time. Will continue to monitor and augment if progression stalls.  Fetal Wellbeing:  Category II Pain Control:  Epidural Anticipated MOD:  NSVD  Aaron Bostwick, MD 03/26/2013, 6:51 AM

## 2013-03-27 NOTE — Lactation Note (Signed)
This note was copied from the chart of Gabriela Mistey Hoffert. Lactation Consultation Note  Patient Name: Gabriela Bradford [id number] Date: 03/27/2013 Reason for consult: Initial assessment;Breast/nipple pain.  Mom has been nursing exclusively since delivery and baby is 84 hours old at time of this LC visit.  Mom's nurse, Marcelino Duster, RN has reported assisting mom with latching but that mom is complaining of sore nipples (bilateral) and having latch difficulty on (R) breast.  LC arrives to observe baby latched in sidelying position on mom's (L) breast with widely flanged lips and deep areolar grasp with rhythmical sucking bursts observed and swallows intermittent.  LC unable to completely assess mom's nipples at this time but based on RN report that both nipples are pink and irritated, LC provides comfort gelpads with instructions for use between feedings but encouraged mom to apply breastmilk to nipples prior to gelpads and continue cue feedings, while ensuring a deep latch for each feeding.LC encouraged review of Baby and Me pp 14 and 20-25 for STS and BF information. LC provided [employer] brochure and reviewed [facility] services and list of community and web site resources.    Maternal Data Formula Feeding for Exclusion: No Infant to breast within first hour of birth: Yes (sleepy but attempt to latch made by mom) Has patient been taught Hand Expression?: Yes Does the patient have breastfeeding experience prior to this delivery?: No  Feeding Feeding Type: Breast Fed Length of feed:  (baby already latched on (L) breast in sidelying position)  LATCH Score/Interventions Latch: Grasps breast easily, tongue down, lips flanged, rhythmical sucking. (latch observed)  Audible Swallowing: Spontaneous and intermittent  Type of Nipple: Everted at rest and after stimulation  Comfort (Breast/Nipple): Filling, red/small blisters or bruises, mild/mod discomfort  Problem noted: Mild/Moderate discomfort Interventions  (Mild/moderate discomfort): Hand expression;Comfort gels  Hold (Positioning): Assistance needed to correctly position infant at breast and maintain latch. Intervention(s): Breastfeeding basics reviewed;Skin to skin  LATCH Score: 8 (based on observation after baby latched to (L) breast)  Lactation Tools Discussed/Used Tools: Comfort gels Expressed milk, cue feedings, STS  Consult Status Consult Status: Follow-up Date: 03/28/13 Follow-up type: In-patient    Warrick Parisian Detar North 03/27/2013, 8:24 PM

## 2013-03-27 NOTE — Progress Notes (Signed)
Post Partum Day 1 s/p NSVD Subjective: Intermittent abdominal cramping but otherwise no complaints, up ad lib, voiding and tolerating PO, lochia greater than menses, she is breast feeding.  Objective: Blood pressure 134/74, pulse 94, temperature 97.7 F (36.5 C), temperature source Axillary, resp. rate 20, height 5\' 10"  (1.778 m), weight 70.761 kg (156 lb), last menstrual period 06/17/2012, SpO2 100.00%, unknown if currently breastfeeding.  Physical Exam:  General: alert, cooperative and no distress Lochia: >menses Chest: CTAB Heart: RRR no m/r/g Abdomen: +BS, soft, nontender,  Uterine Fundus: firm DVT Evaluation: No evidence of DVT seen on physical exam. Extremities: no edema   Recent Labs  03/26/13 0410  HGB 10.8*  HCT 32.5*    Assessment/Plan: 1. S/p NSVD. Doing well. Continue routine postpartum care.  2. She delivered yesterday evening so will plan for discharge tomorrow.  3. She is breast feeding.  4. She is undecided on postpartum contraception but desires a long-term option. Counseled and provided with handouts.  5. She will follow up at [facility] for her postpartum follow up.    LOS: 1 day   Gabriela Bradford 03/27/2013, 8:37 AM

## 2013-03-27 NOTE — Anesthesia Postprocedure Evaluation (Signed)
  Anesthesia Post-op Note  Patient: Gabriela Bradford  Procedure(s) Performed: * No procedures listed *  Patient Location: Mother/Baby  Anesthesia Type:Epidural  Level of Consciousness: awake  Airway and Oxygen Therapy: Patient Spontanous Breathing  Post-op Pain: mild  Post-op Assessment: Patient's Cardiovascular Status Stable and Respiratory Function Stable  Post-op Vital Signs: stable  Complications: No apparent anesthesia complications

## 2013-03-27 NOTE — Progress Notes (Signed)
I have seen and examined this patient and I agree with the above. Cam Hai 9:17 AM 03/27/2013

## 2013-03-28 ENCOUNTER — Ambulatory Visit: Payer: Self-pay

## 2013-03-28 MED ORDER — IBUPROFEN 600 MG PO TABS: 600.0000 mg | ORAL_TABLET | Freq: Four times a day (QID) | ORAL | Status: DC | PRN

## 2013-03-28 NOTE — Discharge Summary (Signed)
Attestation of Attending Supervision of Advanced Practitioner (CNM/NP): Evaluation and management procedures were performed by the Advanced Practitioner under my supervision and collaboration.  I have reviewed the Advanced Practitioner's note and chart, and I agree with the management and plan.  Lewin Pellow 03/28/2013 4:01 PM

## 2013-03-28 NOTE — Lactation Note (Signed)
This note was copied from the chart of Gabriela Michaiah Maiden. Lactation Consultation Note  Follow up consult with this mom and baby, being discharged to home today  at just under 67 days of age. Mom reports breast feeding going well. I reviewed breast feeding teaching with mom from the baby and me book. Mom knows to call for questions/concerns, and is aware of o/p lactation as needed.   Patient Name: Gabriela Bradford [id number] Date: 03/28/2013 Reason for consult: Follow-up assessment   Maternal Data    Feeding    LATCH Score/Interventions                      Lactation Tools Discussed/Used     Consult Status Consult Status: Complete Follow-up type: Call as needed    Gabriela Bradford 03/28/2013, 4:23 PM

## 2013-03-28 NOTE — Discharge Summary (Signed)
Obstetric Discharge Summary Reason for Admission: rupture of membranes Prenatal Procedures: ultrasound Intrapartum Procedures: spontaneous vaginal delivery Postpartum Procedures: none Complications-Operative and Postpartum: 2nd degree perineal laceration Eating, drinking, voiding, ambulating well.  +flatus.  Lochia and pain wnl.  Denies dizziness, lightheadedness, or sob. No complaints.  Hemoglobin  Date Value Range Status  03/26/2013 10.8* 12.0 - 15.0 g/dL Final     HCT  Date Value Range Status  03/26/2013 32.5* 36.0 - 46.0 % Final    Physical Exam:  General: alert, cooperative and no distress Lochia: appropriate Uterine Fundus: firm Incision: n/a DVT Evaluation: No evidence of DVT seen on physical exam. Negative Homan's sign. No cords or calf tenderness. No significant calf/ankle edema.  Discharge Diagnoses: Term Pregnancy-delivered  Discharge Information: Date: 03/28/2013 Activity: pelvic rest Diet: routine Medications: PNV and Ibuprofen Condition: stable Instructions: refer to practice specific booklet Discharge to: home Follow-up Information   Schedule an appointment as soon as possible for a visit with [facility]. (4-6 weeks for your postpartum visit)    Specialty:  Obstetrics and Gynecology   Contact information:   [address] [place] [address] [phone]      Newborn Data: Live born female  Birth Weight: 7 lb 7.6 oz (3390 g) APGAR: 8, 9  Home with mother. Breastfeeding, mirena vs. nexplanon for contraception, understands to remain abstinent until placement OP circumcision  Marge Duncans 03/28/2013, 7:49 AM

## 2013-03-29 ENCOUNTER — Other Ambulatory Visit: Payer: BC Managed Care – PPO

## 2013-03-30 ENCOUNTER — Ambulatory Visit (INDEPENDENT_AMBULATORY_CARE_PROVIDER_SITE_OTHER): Payer: BC Managed Care – PPO | Admitting: Family Medicine

## 2013-03-30 ENCOUNTER — Encounter: Payer: Self-pay | Admitting: Family Medicine

## 2013-03-30 VITALS — BP 123/67 | HR 97 | Ht 69.0 in | Wt 152.0 lb

## 2013-03-30 DIAGNOSIS — R102 Pelvic and perineal pain: Secondary | ICD-10-CM

## 2013-03-30 DIAGNOSIS — N949 Unspecified condition associated with female genital organs and menstrual cycle: Secondary | ICD-10-CM

## 2013-03-30 DIAGNOSIS — N6459 Other signs and symptoms in breast: Secondary | ICD-10-CM

## 2013-03-30 MED ORDER — TRAMADOL HCL 50 MG PO TABS: 50.0000 mg | ORAL_TABLET | Freq: Four times a day (QID) | ORAL | Status: DC | PRN

## 2013-03-30 NOTE — Patient Instructions (Signed)
Breastfeeding, Engorgement Problems  Breastfeeding is usually the best way to feed your baby. Breastfed babies tend to be healthier and less susceptible to disease. Breastfed babies may have better brain development and be less likely to be overweight than formula-fed babies. Breastfeeding also benefits the mother. It will give you time to be close to your baby and helps create a strong bond. It also delays the return of your periods, stimulates your uterus to contract back to normal, and helps you lose some of the weight you gained during pregnancy. Breastfeeding reduces the risk of developing breast cancer. Breastfeeding is also cheaper than formula feeding and does not require mixing formula, heating bottles, or washing extra dishes. Why do not all mothers breastfeed? It's time consuming. Newborn babies have to be fed up to 12 times per day. This time use may become quite a challenge if you are planning to go back to work within the first few weeks of giving birth or have other children. Once breastfeeding is well established, feedings usually become more regular and more widely spaced. Some mothers do not nurse their babies because they have problems early on. If at all possible, begin breastfeeding your baby within an hour after delivery. The first milk you produce is called colostrum, which is packed with nutrients and disease-fighting substances that will help nourish and protect your baby against infections as he or she grows up. Some babies are unable to breastfeed because of a premature birth and being small causing weakness and difficulty sucking. Sometimes birth defects of the mouth (cleft lip or cleft palate) and digestive problems (such as galactosemia) may be reasons not to nurse. See a lactation consultant if you have a breast infection or breast abscess, breast cancer or other cancer, previous surgery or radiation treatment, or inadequate milk supply (uncommon). Some mothers are advised not to  breastfeed due to health problems such as:  Serious illnesses or infectious disease .  Active, untreated tuberculosis.  Severe malnutrition.  Active herpes lesions on the breast.  Alcohol or drug addiction.  Radiation treatment.  Radioactive iodine treatment.  Chickenpox or shingles.  Taking psychiatric medications (talk to your caregiver).  Leukemia human T cell type I and II.  Severe malnutrition.  HIV (human immunodeficiency virus) infection.  Nephritis. It is natural for minor problems to arise in first time breastfeeding. Problems you may have and some solutions are as follows. Nipple soreness may be caused by:  Improper position of your baby.  Improper feeding techniques.  Improper nipple care. For many women, there is no identified cause. A simple change in your baby's position while feeding may relieve nipple soreness. Some breastfeeding mothers report nipple soreness only during the beginning adjustment period.  Breastfeeding should not be painful. If there is tenderness at first, it should gradually go away as the days go by. Poor latch-on and positioning are common causes of sore nipples. This is usually because the baby is not getting enough of the areola (the colored portion around the nipple) into his or her mouth, and is sucking mostly on the nipple. In general, nurse early and often, nurse with the nipple and areola in the baby's mouth, not just the nipple, and feed your baby whenever he or she shows interest. If you have sore nipples and put off feedings because of pain, this can lead to your breasts becoming overly full. This may cause plugged milk ducts in the breast. If your baby is latched on correctly, they should be able to  nurse as long as needed without causing any pain. If it hurts, take the baby off of your breast and try again. Ask for help if it is still painful for you. Do not delay feedings, and try to relax so your let-down reflex comes easily. You  also can hand-express a little milk before beginning the feeding so your baby does not clamp down harder, waiting for the milk to come. After nursing, you can also express a few drops of milk and gently rub it on your nipples. Human milk has natural healing properties. Let your nipples air-dry after feeding, or wear a soft-cotton shirt.   Wearing a nipple shield during nursing will not relieve sore nipples. They actually can prolong soreness by making it hard for the baby to learn to nurse without the shield.  Avoid wearing bras or clothes that are too tight and put pressure on your nipples but with good support. Change nursing pads often to avoid trapping in moisture. Only wear cotton bras.  Avoid using soap, ointments or other chemicals on your nipples. Make sure to avoid products that must be removed before nursing.  Washing with clean water is all that is necessary to keep your nipples and breasts clean.  Try rubbing pure lanolin on your nipples after breastfeeding to soothe the pain. Do not wash it off before nursing.  Make sure you get enough rest, eat healthy foods, and get enough fluids to help the healing process. If you have very sore nipples, you can ask your caregiver about using non-aspirin pain relievers.  If one nipple is tender, begin nursing from the other breast first. ENGORGEMENT Engorgement is a condition after pregnancy, when your breasts feel very hard and painful. You may also have breast swelling, tenderness, warmth, redness, throbbing and flattening of the nipple. Engorgement may cause a low-grade fever. This can be confused with a breast infection. Engorgement is the result of the milk building up, and usually happens during the third to fifth day after birth. This slows circulation, and when blood and lymph move through the breasts, fluid from the blood vessels can seep into the breast tissues.  All of the following can cause engorgement:   Poor  positioning.  Infrequent feedings.  Giving bottles of water, juice, formula, or breast milk or using a pacifier. All of these cut down on your feeding and may lead to engorgement.  Changing the breastfeeding schedule to less frequent feedings.  The baby changes the nursing pattern.  Having a baby with a weak suck who is not able to nurse well.  Fatigue, stress, or anemia in the mother.  A large milk supply.  Nipple damage.  Breast abnormalities. Engorgement can lead to plugged ducts or a breast infection, so it is important to try to prevent it before this happens. If treated properly, engorgement should only usually last for one to two days. Minimize engorgement by making sure the baby is latched on and positioned correctly at your breast. Nurse frequently after birth. Allow the baby to nurse as long as he or she likes, as long as he or she is latched on well and sucking well. In the early days when your milk is coming in, you should awaken a sleepy baby every 2 to 3 hours to breastfeed. Breastfeeding often on the affected side helps to remove the milk, keeps milk moving freely. This prevents overfilling of the breast.   Avoid additional bottles and pacifiers.  Try hand expressing or pumping a little milk  to first soften the breast, areola, and nipple before breastfeeding, or massage the breast.  Cold compresses in between feedings can help ease pain and swelling.  If you are returning to work, try to pump your milk on the same schedule as when your baby was breastfed.  Eat a well-balanced diet and drink plenty of fluids.  Use a well-fitting, supportive bra that is not too tight.  Use a breast pump to keep up with your nursing schedule if your baby is not taking in enough milk or you feel you are getting engorged. SEEK MEDICAL CARE IF: Your engorgement lasts for more than 2 days even after treating it (contact a Advertising copywriter or your doctor). Document Released: 09/07/2004  Document Revised: 08/05/2011 Document Reviewed: 01/26/2008 [facility] Patient Information 2014 [place], [place].

## 2013-03-30 NOTE — Assessment & Plan Note (Signed)
Pump and nurse.  Ice and warmth with pumping.  Store extra milk.

## 2013-03-30 NOTE — Assessment & Plan Note (Signed)
Related to delivery and healing. No sign of infection right now--Ultram given.  Use peri-bottle and dermoplast.  Nothing per vagina.

## 2013-03-30 NOTE — Progress Notes (Signed)
  Subjective:    Patient ID: Che Below, female    DOB: 01-02-1986, 27 y.o.   MRN: [id number]  Vaginal Pain Pertinent negatives include no abdominal pain, chills, fever or rash.    5 d pp.  Here with significant breast engorgement. Also c/o vulvar pain, not relieved with Ibuprofen.  Review of Systems  Constitutional: Negative for fever and chills.  Gastrointestinal: Negative for abdominal pain.  Genitourinary: Positive for vaginal pain.  Skin: Negative for rash.       Objective:   Physical Exam  Vitals reviewed. Constitutional: She appears well-developed and well-nourished.  Pulmonary/Chest:  Fully engorged breasts.  Genitourinary:  Left labial edema.  Healing vaginal mucosa with sutures visible.          Assessment & Plan:

## 2013-03-31 ENCOUNTER — Inpatient Hospital Stay (HOSPITAL_COMMUNITY): Admission: RE | Admit: 2013-03-31 | Payer: BC Managed Care – PPO | Source: Ambulatory Visit

## 2013-05-10 ENCOUNTER — Ambulatory Visit: Payer: BC Managed Care – PPO | Admitting: Family Medicine

## 2013-05-11 ENCOUNTER — Ambulatory Visit (INDEPENDENT_AMBULATORY_CARE_PROVIDER_SITE_OTHER): Payer: BC Managed Care – PPO | Admitting: Nurse Practitioner

## 2013-05-11 ENCOUNTER — Ambulatory Visit: Payer: BC Managed Care – PPO | Admitting: Obstetrics & Gynecology

## 2013-05-11 ENCOUNTER — Encounter: Payer: Self-pay | Admitting: Nurse Practitioner

## 2013-05-11 VITALS — BP 114/54 | HR 80 | Ht 70.0 in | Wt 133.0 lb

## 2013-05-11 DIAGNOSIS — Z309 Encounter for contraceptive management, unspecified: Secondary | ICD-10-CM

## 2013-05-11 DIAGNOSIS — N760 Acute vaginitis: Secondary | ICD-10-CM

## 2013-05-11 DIAGNOSIS — A499 Bacterial infection, unspecified: Secondary | ICD-10-CM

## 2013-05-11 DIAGNOSIS — B9689 Other specified bacterial agents as the cause of diseases classified elsewhere: Secondary | ICD-10-CM

## 2013-05-11 MED ORDER — METRONIDAZOLE 500 MG PO TABS: 500.0000 mg | ORAL_TABLET | Freq: Two times a day (BID) | ORAL | Status: DC

## 2013-05-11 NOTE — Addendum Note (Signed)
Addended by: Barbara Cower on: 05/11/2013 04:32 PM   Modules accepted: Orders

## 2013-05-11 NOTE — Progress Notes (Signed)
History:  Gabriela Bradford is a 27 y.o. G1P1001 who presents to [facility] clinic today for postpartum visit. Her baby boy was NSVD on 03/24/16 weighed in at 7lbs and 7 oz. , mother and son are doing well. She is exclusively breast feeding.  She feels that she has a bacterial vaginosis. She believes she will use a Mirena IUD for birth control. She has not resumed sexual intercourse. She is spotting a little, but no actual menses.   The following portions of the patient's history were reviewed and updated as appropriate: allergies, current medications, past family history, past medical history, past social history, past surgical history and problem list.  Review of Systems:  Pertinent items are noted in HPI.  Objective:  Physical Exam BP 114/54  Pulse 80  Ht 5\' 10"  (1.778 m)  Wt 133 lb (60.328 kg)  BMI 19.08 kg/m2  Breastfeeding? Yes GENERAL: Well-developed, well-nourished female in no acute distress.  HEENT: Normocephalic, atraumatic.  NECK: Supple. Normal thyroid.  LUNGS: Normal rate. Clear to auscultation bilaterally.  HEART: Regular rate and rhythm with no adventitious sounds.  BREASTS: Symmetric in size. No masses, skin changes, nipple drainage, or lymphadenopathy. ABDOMEN: Soft, nontender, nondistended. No organomegaly. Normal bowel sounds appreciated in all quadrants.  PELVIC: Normal external female genitalia. Vagina is pink and rugated. Thin, clear odorous discharge. Normal cervix . Uterus is normal in size. No adnexal mass or tenderness.  EXTREMITIES: No cyanosis, clubbing, or edema, 2+ distal pulses.   Labs and Imaging No results found.  Assessment & Plan:  Assessment:  Postpartum Care Contraception Bacterial Vaginosis  Plans:  Discussed breast feeding Given brochure for Gabriela Bradford / will return after reviewing. No unprotected intercourse Flagyl 500 mg one po BID x 7 days No alcohol RTC for IUD  Gabriela Phenix, NP 05/11/2013 2:52 PM

## 2013-05-11 NOTE — Patient Instructions (Signed)
Contraception Choices Contraception (birth control) is the use of any methods or devices to prevent pregnancy. Below are some methods to help avoid pregnancy. HORMONAL METHODS   Contraceptive implant This is a thin, plastic tube containing progesterone hormone. It does not contain estrogen hormone. Your health care provider inserts the tube in the inner part of the upper arm. The tube can remain in place for up to 3 years. After 3 years, the implant must be removed. The implant prevents the ovaries from releasing an egg (ovulation), thickens the cervical mucus to prevent sperm from entering the uterus, and thins the lining of the inside of the uterus.  Progesterone-only injections These injections are given every 3 months by your health care provider to prevent pregnancy. This synthetic progesterone hormone stops the ovaries from releasing eggs. It also thickens cervical mucus and changes the uterine lining. This makes it harder for sperm to survive in the uterus.  Birth control pills These pills contain estrogen and progesterone hormone. They work by preventing the ovaries from releasing eggs (ovulation). They also cause the cervical mucus to thicken, preventing the sperm from entering the uterus. Birth control pills are prescribed by a health care provider.Birth control pills can also be used to treat heavy periods.  Minipill This type of birth control pill contains only the progesterone hormone. They are taken every day of each month and must be prescribed by your health care provider.  Birth control patch The patch contains hormones similar to those in birth control pills. It must be changed once a week and is prescribed by a health care provider.  Vaginal ring The ring contains hormones similar to those in birth control pills. It is left in the vagina for 3 weeks, removed for 1 week, and then a new one is put back in place. The patient must be comfortable inserting and removing the ring from the  vagina.A health care provider's prescription is necessary.  Emergency contraception Emergency contraceptives prevent pregnancy after unprotected sexual intercourse. This pill can be taken right after sex or up to 5 days after unprotected sex. It is most effective the sooner you take the pills after having sexual intercourse. Most emergency contraceptive pills are available without a prescription. Check with your pharmacist. Do not use emergency contraception as your only form of birth control. BARRIER METHODS   Female condom This is a thin sheath (latex or rubber) that is worn over the penis during sexual intercourse. It can be used with spermicide to increase effectiveness.  Female condom. This is a soft, loose-fitting sheath that is put into the vagina before sexual intercourse.  Diaphragm This is a soft, latex, dome-shaped barrier that must be fitted by a health care provider. It is inserted into the vagina, along with a spermicidal jelly. It is inserted before intercourse. The diaphragm should be left in the vagina for 6 to 8 hours after intercourse.  Cervical cap This is a round, soft, latex or plastic cup that fits over the cervix and must be fitted by a health care provider. The cap can be left in place for up to 48 hours after intercourse.  Sponge This is a soft, circular piece of polyurethane foam. The sponge has spermicide in it. It is inserted into the vagina after wetting it and before sexual intercourse.  Spermicides These are chemicals that kill or block sperm from entering the cervix and uterus. They come in the form of creams, jellies, suppositories, foam, or tablets. They do not require a   prescription. They are inserted into the vagina with an applicator before having sexual intercourse. The process must be repeated every time you have sexual intercourse. INTRAUTERINE CONTRACEPTION  Intrauterine device (IUD) This is a T-shaped device that is put in a woman's uterus during a  menstrual period to prevent pregnancy. There are 2 types:  Copper IUD This type of IUD is wrapped in copper wire and is placed inside the uterus. Copper makes the uterus and fallopian tubes produce a fluid that kills sperm. It can stay in place for 10 years.  Hormone IUD This type of IUD contains the hormone progestin (synthetic progesterone). The hormone thickens the cervical mucus and prevents sperm from entering the uterus, and it also thins the uterine lining to prevent implantation of a fertilized egg. The hormone can weaken or kill the sperm that get into the uterus. It can stay in place for 3 5 years, depending on which type of IUD is used. PERMANENT METHODS OF CONTRACEPTION  Female tubal ligation This is when the woman's fallopian tubes are surgically sealed, tied, or blocked to prevent the egg from traveling to the uterus.  Hysteroscopic sterilization This involves placing a small coil or insert into each fallopian tube. Your doctor uses a technique called hysteroscopy to do the procedure. The device causes scar tissue to form. This results in permanent blockage of the fallopian tubes, so the sperm cannot fertilize the egg. It takes about 3 months after the procedure for the tubes to become blocked. You must use another form of birth control for these 3 months.  Female sterilization This is when the female has the tubes that carry sperm tied off (vasectomy).This blocks sperm from entering the vagina during sexual intercourse. After the procedure, the man can still ejaculate fluid (semen). NATURAL PLANNING METHODS  Natural family planning This is not having sexual intercourse or using a barrier method (condom, diaphragm, cervical cap) on days the woman could become pregnant.  Calendar method This is keeping track of the length of each menstrual cycle and identifying when you are fertile.  Ovulation method This is avoiding sexual intercourse during ovulation.  Symptothermal method This is  avoiding sexual intercourse during ovulation, using a thermometer and ovulation symptoms.  Post ovulation method This is timing sexual intercourse after you have ovulated. Regardless of which type or method of contraception you choose, it is important that you use condoms to protect against the transmission of sexually transmitted infections (STIs). Talk with your health care provider about which form of contraception is most appropriate for you. Document Released: 05/13/2005 Document Revised: 01/13/2013 Document Reviewed: 11/05/2012 Exit[employer] Patient Information 2014 [employer].  

## 2013-05-12 ENCOUNTER — Encounter: Payer: Self-pay | Admitting: Obstetrics and Gynecology

## 2013-05-12 ENCOUNTER — Ambulatory Visit (INDEPENDENT_AMBULATORY_CARE_PROVIDER_SITE_OTHER): Payer: BC Managed Care – PPO | Admitting: Obstetrics and Gynecology

## 2013-05-12 VITALS — BP 110/64 | HR 76 | Ht 70.0 in | Wt 133.0 lb

## 2013-05-12 DIAGNOSIS — Z30430 Encounter for insertion of intrauterine contraceptive device: Secondary | ICD-10-CM

## 2013-05-12 DIAGNOSIS — Z01812 Encounter for preprocedural laboratory examination: Secondary | ICD-10-CM

## 2013-05-12 LAB — WET PREP FOR TRICH, YEAST, CLUE
Clue Cells Wet Prep HPF POC: NONE SEEN
Trich, Wet Prep: NONE SEEN
Yeast Wet Prep HPF POC: NONE SEEN

## 2013-05-12 LAB — POCT URINE PREGNANCY: Preg Test, Ur: NEGATIVE

## 2013-05-12 NOTE — Progress Notes (Signed)
Patient ID: Gabriela Bradford, female   DOB: 01-16-1986, 27 y.o.   MRN: [id number] 27 yo G1P1 s/p NSVD on 10/29 here for IUD insertion  IUD Procedure Note Patient identified, informed consent performed, signed copy in chart, time out was performed.  Urine pregnancy test negative.  Speculum placed in the vagina.  Cervix visualized.  Cleaned with Betadine x 2.  Grasped anteriorly with a single tooth tenaculum.  Uterus sounded to 8 cm.  Mirena IUD placed per manufacturer's recommendations.  Strings trimmed to 3 cm. Tenaculum was removed, good hemostasis noted.  Patient tolerated procedure well.   Patient given post procedure instructions and Mirena care card with expiration date.  Patient is asked to check IUD strings periodically and follow up in 4-6 weeks for IUD check.

## 2013-06-17 ENCOUNTER — Ambulatory Visit (INDEPENDENT_AMBULATORY_CARE_PROVIDER_SITE_OTHER): Payer: BC Managed Care – PPO | Admitting: Podiatry

## 2013-06-17 ENCOUNTER — Encounter: Payer: Self-pay | Admitting: Podiatry

## 2013-06-17 VITALS — BP 102/66 | HR 82 | Resp 16 | Ht 69.0 in | Wt 130.0 lb

## 2013-06-17 DIAGNOSIS — L608 Other nail disorders: Secondary | ICD-10-CM

## 2013-06-17 DIAGNOSIS — L600 Ingrowing nail: Secondary | ICD-10-CM

## 2013-06-17 MED ORDER — NEOMYCIN-POLYMYXIN-HC 3.5-10000-1 OT SOLN: OTIC | Status: DC

## 2013-06-17 NOTE — Progress Notes (Signed)
   Subjective:    Patient ID: Gabriela Bradford, female    DOB: [birth year], 28 y.o.   MRN: [id number]  HPI Comments: My big toenail on my right foot is sore and my toenails are discolored. i think they have stopped growing. My nail has been sore for two days. i have pedicures and they usually pull it out for me. i havent done anything for my nails. i have tried messing with the ingrown nail but didn't do any good.     Review of Systems  HENT: Negative.   Eyes: Negative.   Respiratory: Negative.   Cardiovascular: Negative.   Gastrointestinal: Negative.   Endocrine: Negative.   Genitourinary: Negative.   Musculoskeletal: Negative.   Skin:       Change in nails  Allergic/Immunologic: Positive for food allergies.  Hematological: Negative.   Psychiatric/Behavioral: Negative.        Objective:   Physical Exam: Vital signs are stable she is alert and oriented x3. Pulses are strongly palpable bilateral. Deep tendon reflexes are intact bilateral. Muscle strength is 5 over 5 dorsiflexors plantar flexors inverters everters all intrinsic musculature is intact. Orthopedic evaluation demonstrates mild pes planus bilateral. Cutaneous evaluation demonstrates supple well hydrated cutis nail trauma hallux left is resulting in nail dystrophy and distal onychomycosis left. She is a sharp incurvated nail margin along the fibular border of the hallux right. With erythema and edema gross granulation tissue in a sharp incurvated nail edge. She also has thickening of the nail plate which is consistent with nail dystrophy possibly onychomycosis.          Assessment & Plan:  Assessment: Nail dystrophy hallux left. Ingrown nail paronychia abscess hallux right. Rule out onychomycosis of the nail plate hallux bilateral.  Plan: At this point we performed a chemical matrixectomy to the fibular border of the hallux right she tolerated this very well. She will start soaking in Betadine and water starting tomorrow  apply Cortisporin otic as directed. I will followup with her in one week. Samples were taken today do sent for pathologic and microbiologic evaluation.

## 2013-06-17 NOTE — Patient Instructions (Signed)

## 2013-06-23 ENCOUNTER — Encounter: Payer: Self-pay | Admitting: Podiatry

## 2013-06-23 ENCOUNTER — Ambulatory Visit (INDEPENDENT_AMBULATORY_CARE_PROVIDER_SITE_OTHER): Payer: BC Managed Care – PPO | Admitting: Podiatry

## 2013-06-23 VITALS — BP 112/71 | HR 101 | Resp 16 | Ht 69.0 in | Wt 130.0 lb

## 2013-06-23 DIAGNOSIS — Z9889 Other specified postprocedural states: Secondary | ICD-10-CM

## 2013-06-23 NOTE — Progress Notes (Signed)
She presents today for followup of her matrixectomy to the fibular border hallux right. She denies fever chills nausea vomiting muscle aches and pains. States that she's only been soaking it once daily.  Objective: Vital signs are stable she is alert and oriented x3. There is no erythema edema saline is drainage or odor to the hallux right.  Assessment: Well-healing surgical toe hallux right.  Plan: Discontinue Betadine start with Epsom salts in warm water soaks. Soak twice daily and apply Cortisporin otic as directed. She will continue to cover during the day and leave open at night. I will followup with her once her mycotic  reports come in.

## 2013-08-03 ENCOUNTER — Encounter: Payer: Self-pay | Admitting: *Deleted

## 2013-08-03 NOTE — Progress Notes (Signed)
Sent pt post card asking pt to call office for pathology results. When calling # will go to voicemail and voicemail is full, can not leave message. Results are no fungus, would she like a topical called in to her pharmacy to try to thin out toenails.

## 2013-08-24 ENCOUNTER — Encounter: Payer: Self-pay | Admitting: Podiatry

## 2013-10-07 ENCOUNTER — Telehealth: Payer: Self-pay | Admitting: *Deleted

## 2013-10-07 MED ORDER — FLUCONAZOLE 150 MG PO TABS: 150.0000 mg | ORAL_TABLET | Freq: Once | ORAL | Status: DC

## 2013-10-07 NOTE — Telephone Encounter (Signed)
Patient has a yeast infection and would like to have Diflucan called in.  She will make an appointment to be seen if the irritation continues.

## 2014-03-28 ENCOUNTER — Encounter: Payer: Self-pay | Admitting: Podiatry

## 2014-07-12 ENCOUNTER — Encounter: Payer: Self-pay | Admitting: Obstetrics & Gynecology

## 2014-07-12 ENCOUNTER — Ambulatory Visit (INDEPENDENT_AMBULATORY_CARE_PROVIDER_SITE_OTHER): Payer: Self-pay | Admitting: Obstetrics & Gynecology

## 2014-07-12 VITALS — BP 119/71 | HR 86 | Ht 69.0 in | Wt 134.4 lb

## 2014-07-12 DIAGNOSIS — Z30018 Encounter for initial prescription of other contraceptives: Secondary | ICD-10-CM

## 2014-07-12 DIAGNOSIS — B9689 Other specified bacterial agents as the cause of diseases classified elsewhere: Secondary | ICD-10-CM

## 2014-07-12 DIAGNOSIS — A499 Bacterial infection, unspecified: Secondary | ICD-10-CM

## 2014-07-12 DIAGNOSIS — N898 Other specified noninflammatory disorders of vagina: Secondary | ICD-10-CM

## 2014-07-12 DIAGNOSIS — Z30011 Encounter for initial prescription of contraceptive pills: Secondary | ICD-10-CM

## 2014-07-12 DIAGNOSIS — T8332XA Displacement of intrauterine contraceptive device, initial encounter: Secondary | ICD-10-CM

## 2014-07-12 DIAGNOSIS — Z30432 Encounter for removal of intrauterine contraceptive device: Secondary | ICD-10-CM

## 2014-07-12 DIAGNOSIS — N760 Acute vaginitis: Secondary | ICD-10-CM

## 2014-07-12 DIAGNOSIS — T8339XA Other mechanical complication of intrauterine contraceptive device, initial encounter: Secondary | ICD-10-CM

## 2014-07-12 MED ORDER — NORETHINDRONE ACET-ETHINYL EST 1-20 MG-MCG PO TABS: 1.0000 | ORAL_TABLET | Freq: Every day | ORAL | Status: DC

## 2014-07-12 MED ORDER — METRONIDAZOLE 500 MG PO TABS: 500.0000 mg | ORAL_TABLET | Freq: Two times a day (BID) | ORAL | Status: AC

## 2014-07-12 NOTE — Progress Notes (Signed)
   CLINIC ENCOUNTER NOTE  History:  29 y.o. G1P1001 here today for evaluation of abnormal vaginal discharge and IUD check. Mirena was placed 05/12/2013.  She reports spotting every day since IUD placement and recurrent malodorous vaginal discharge. No pruritus or other symptoms.  The following portions of the patient's history were reviewed and updated as appropriate: allergies, current medications, past family history, past medical history, past social history, past surgical history and problem list. Normal pap on 06/01/12.    Review of Systems:  Pertinent items are noted in HPI.  Objective:  Physical Exam BP 119/71 mmHg  Pulse 86  Ht 5\' 9"  (1.753 m)  Wt 134 lb 6.4 oz (60.963 kg)  BMI 19.84 kg/m2  LMP 07/12/2014 Gen: NAD Abd: Soft, nontender and nondistended Pelvic: Normal appearing external genitalia; normal appearing vaginal mucosa and cervix.  Inferior part of Mirena IUD noted to be hanging out of external os; about half of the length of IUD was outside the cervix.  Brown malodorous discharge noted, wet prep obtained.   IUD Removal  Patient identified, informed consent performed, verbal consent obtained.  During the pelvic exam, the strings of the partially expelled IUD were grasped and pulled using ring forceps. The IUD was removed in its entirety. Patient tolerated the procedure well.   Patient plans to use OCPs for contraception.    Assessment & Plan:  Abnormal vaginal discharge likely secondary to constant spotting due to expelled IUD; BV cannot be ruled out,  Metronidazole prescribed; will follow up results of wet prep and manage accordingly. IUD removed; Loestrin prescribed. Will follow up in 2 months for OCP check. Routine preventative health maintenance measures emphasized.   Gabriela CollinsUGONNA  [name], MD, FACOG Attending Obstetrician & Gynecologist Center for [facility], [facility]

## 2014-07-12 NOTE — Patient Instructions (Signed)
Return to clinic for any scheduled appointments or for any gynecologic concerns as needed.   

## 2014-07-13 LAB — WET PREP, GENITAL
Trich, Wet Prep: NONE SEEN
Yeast Wet Prep HPF POC: NONE SEEN

## 2014-07-19 ENCOUNTER — Other Ambulatory Visit: Payer: Self-pay | Admitting: *Deleted

## 2014-07-19 DIAGNOSIS — B379 Candidiasis, unspecified: Secondary | ICD-10-CM

## 2014-07-19 MED ORDER — FLUCONAZOLE 150 MG PO TABS: 150.0000 mg | ORAL_TABLET | Freq: Once | ORAL | Status: DC

## 2014-07-19 NOTE — Telephone Encounter (Signed)
Patient called and needed something called in for a yeast infection.  I have sent in Diflucan to patients pharmacy.

## 2014-09-05 ENCOUNTER — Ambulatory Visit: Payer: Medicaid Other | Admitting: Obstetrics & Gynecology

## 2014-12-15 IMAGING — US US OB LIMITED
1 series · 13 of 28 positions shown · non-contrast
Comparison: none

[Series 1: us ob follow up · 37 acquisitions, 13 frames shown]
[im 2/37]
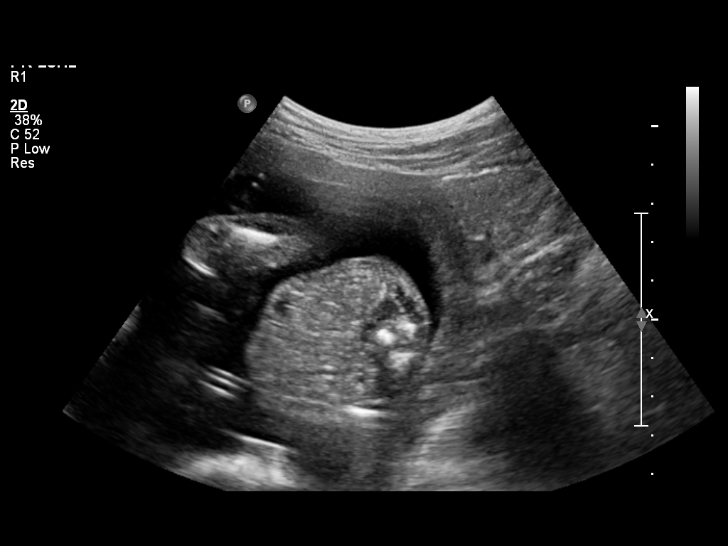
[im 5/37]
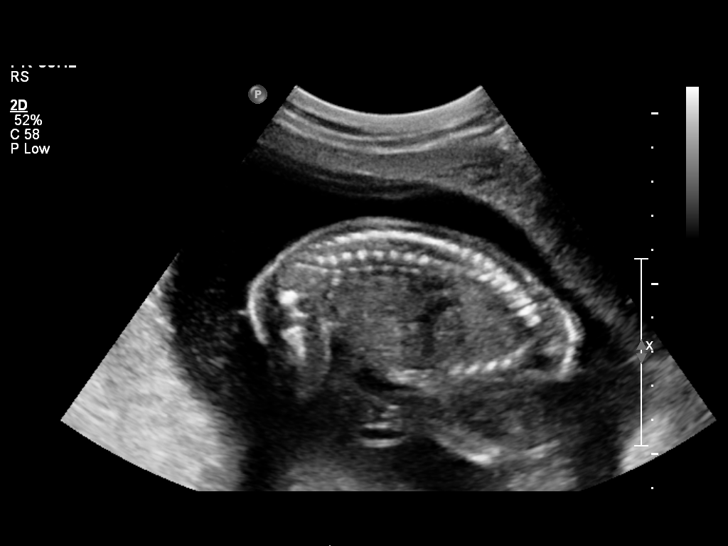
[im 7/37]
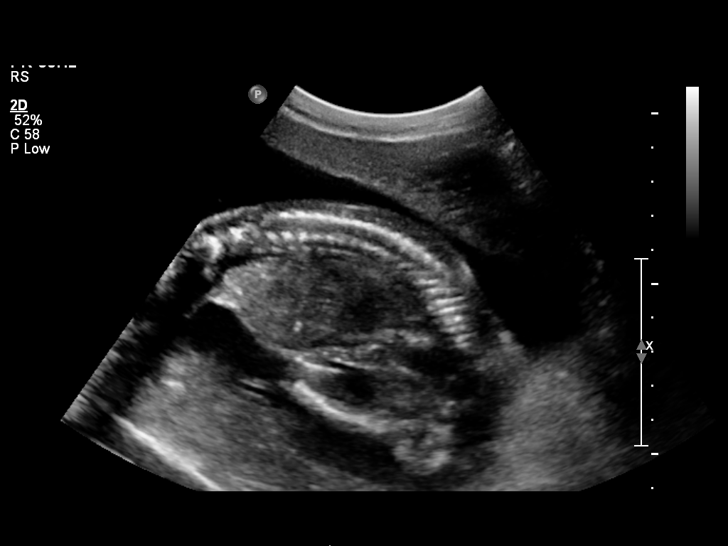
[im 10/37]
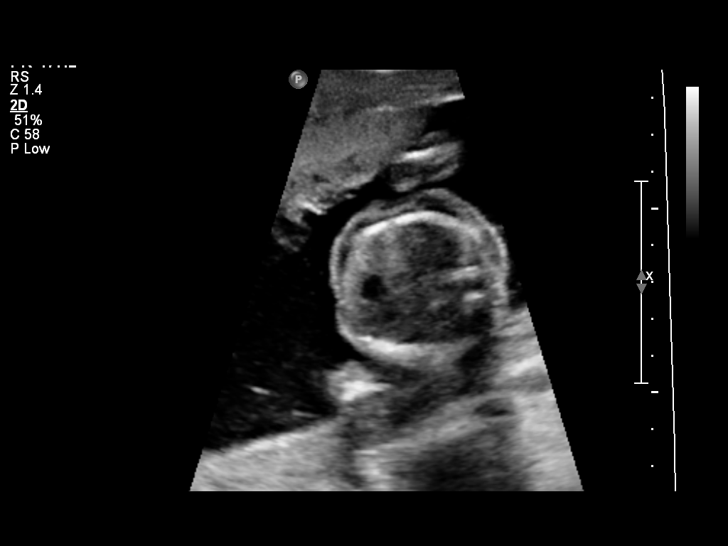
[im 13/37]
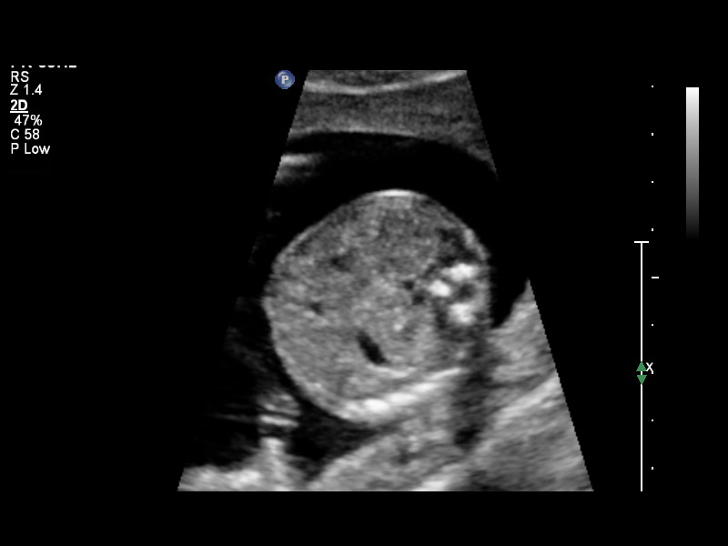
[im 15/37]
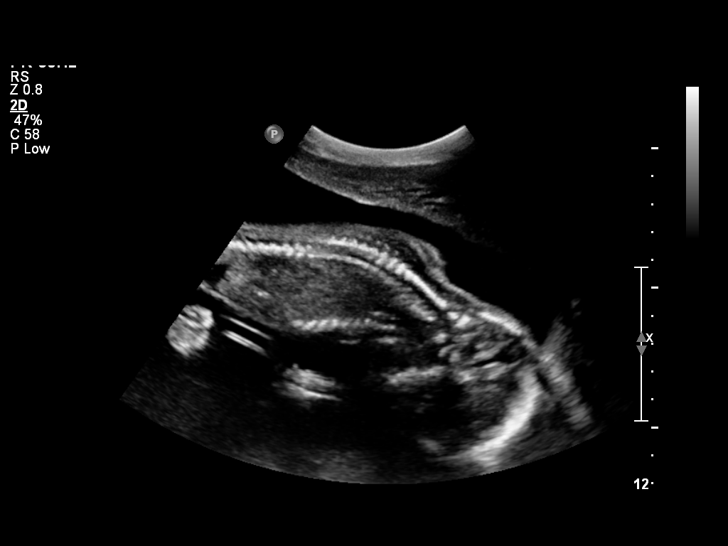
[im 19/37]
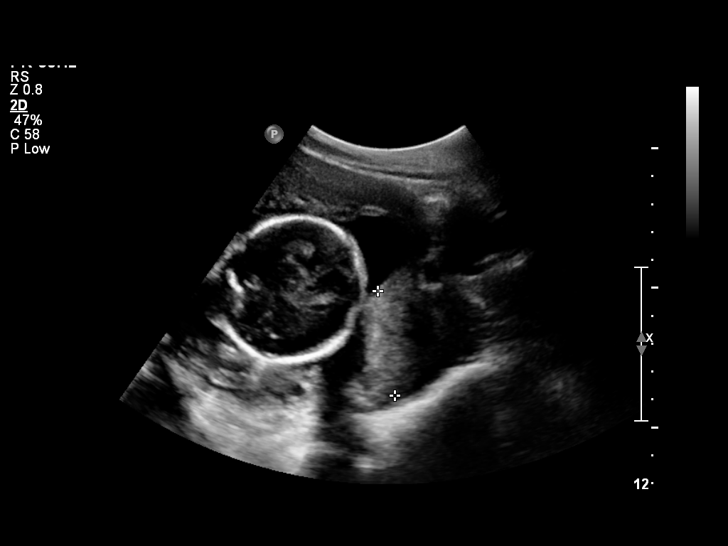
[im 22/37]
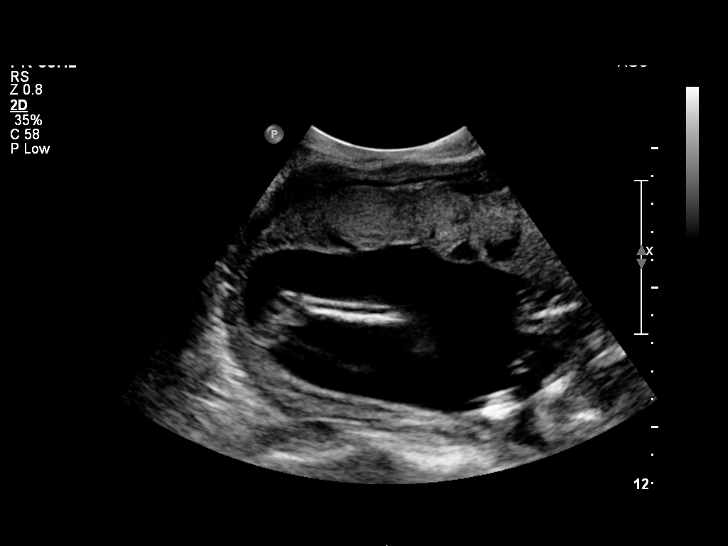
[im 25/37]
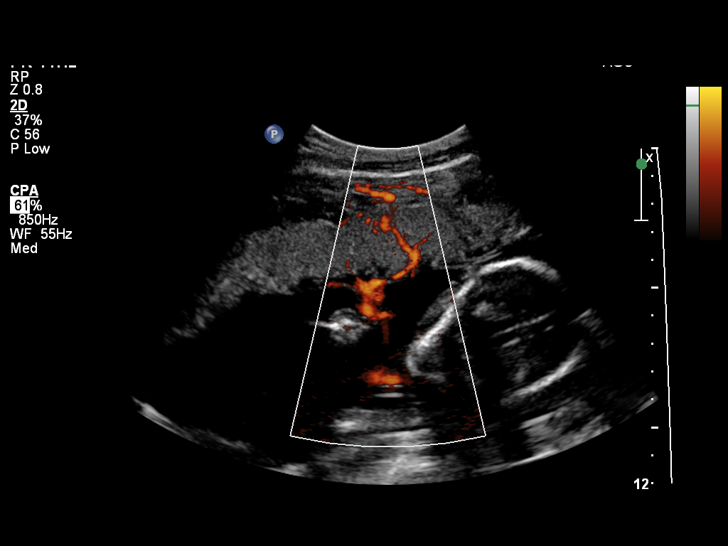
[im 27/37]
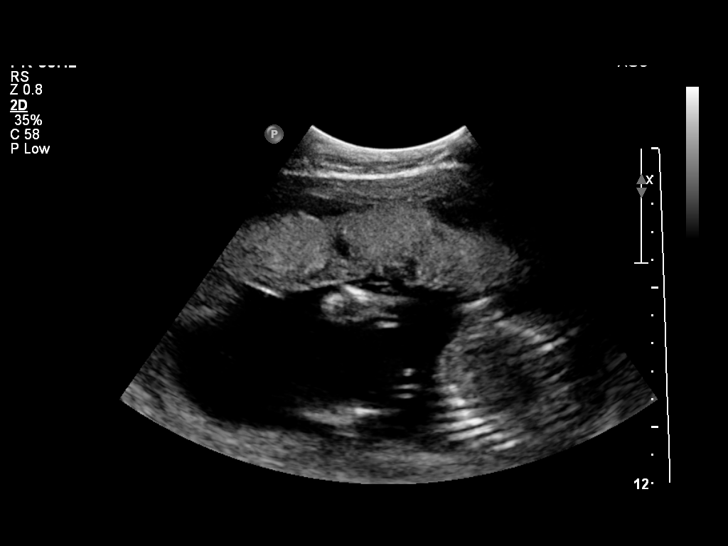
[im 30/37]
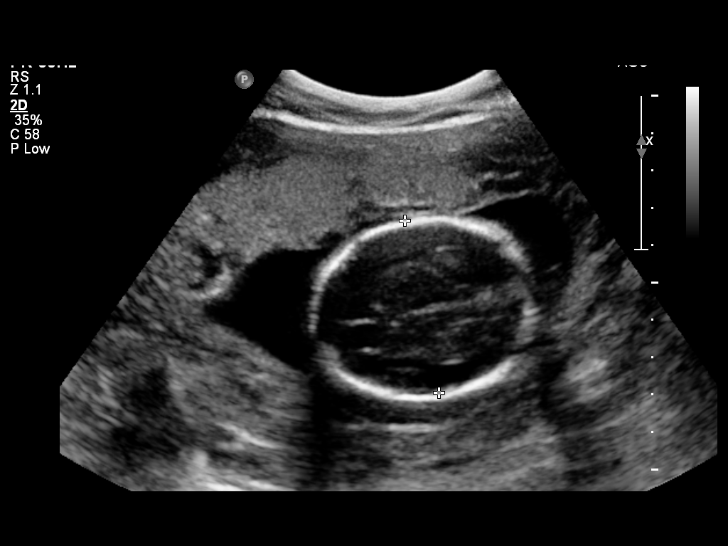
[im 33/37]
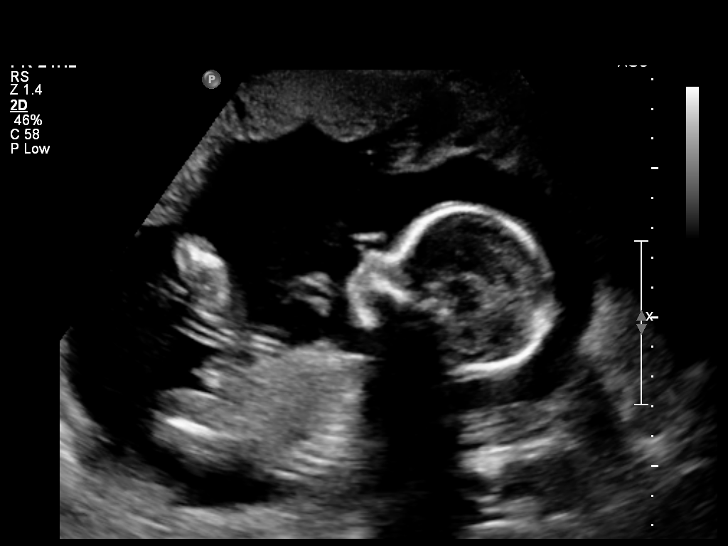
[im 35/37]
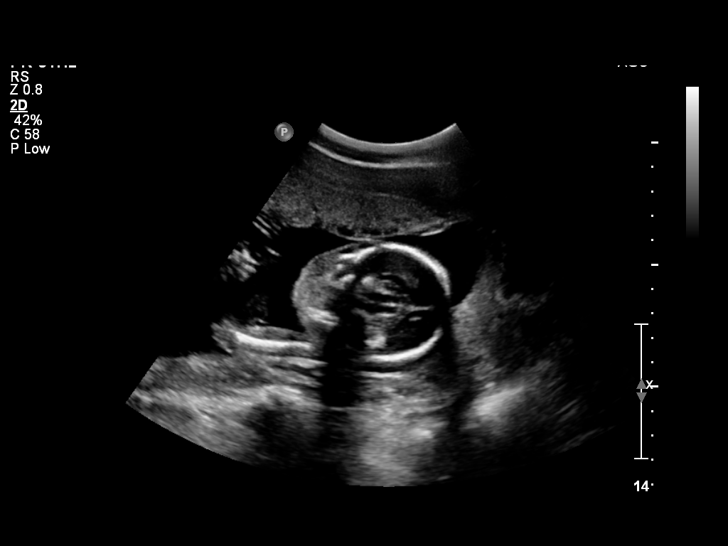

[13 of 28 positions shown; findings below may reference images not displayed]

OBSTETRICS REPORT
                      (Signed Final 11/05/2012 [DATE])

Service(s) Provided

 [HOSPITAL]                                         76815.0
Indications

 Follow-up incomplete fetal anatomic evaluation -
 spine views
Fetal Evaluation

 Num Of Fetuses:    1
 Fetal Heart Rate:  149                         bpm
 Cardiac Activity:  Observed
 Presentation:      Cephalic
 Placenta:          Anterior, above cervical os
 P. Cord            Visualized, central
 Insertion:

 Amniotic Fluid
 AFI FV:      Subjectively within normal limits
                                             Larg Pckt:   4.94   cm
Biometry

 BPD:     46.6  mm    G. Age:   20w 1d
Gestational Age

 LMP:           20w 1d       Date:   06/17/12                 EDD:   03/24/13
 U/S Today:     20w 1d                                        EDD:   03/24/13
 Best:          20w 1d    Det. By:   LMP  (06/17/12)          EDD:   03/24/13
Cervix Uterus Adnexa

 Cervical Length:   3.61      cm

 Cervix:       Normal appearance by transabdominal scan.
 Uterus:       No abnormality visualized.
 Cul De Sac:   No free fluid seen.

 Left Ovary:   Not visualized.
 Right Ovary:  Not visualized.
 Adnexa:     No adnexal mass visualized.
 Comment:    Limited imaging of fetal spine shows normal appearance
             on today's exam.
Impression

 Single living intrauterine fetus in Cephalic presentation.
 Normal amniotic fluid volume.
 Limited images of fetal spine appear normal.

 questions or concerns.

## 2015-07-24 ENCOUNTER — Other Ambulatory Visit: Payer: Self-pay | Admitting: Obstetrics & Gynecology

## 2015-11-14 ENCOUNTER — Other Ambulatory Visit: Payer: Self-pay | Admitting: Obstetrics and Gynecology

## 2015-11-14 ENCOUNTER — Encounter: Payer: Self-pay | Admitting: Obstetrics and Gynecology

## 2015-11-14 ENCOUNTER — Ambulatory Visit (INDEPENDENT_AMBULATORY_CARE_PROVIDER_SITE_OTHER): Payer: BLUE CROSS/BLUE SHIELD | Admitting: Obstetrics and Gynecology

## 2015-11-14 VITALS — BP 127/77 | HR 101 | Resp 16 | Ht 69.0 in | Wt 131.0 lb

## 2015-11-14 DIAGNOSIS — Z01419 Encounter for gynecological examination (general) (routine) without abnormal findings: Secondary | ICD-10-CM | POA: Diagnosis not present

## 2015-11-14 DIAGNOSIS — Z124 Encounter for screening for malignant neoplasm of cervix: Secondary | ICD-10-CM

## 2015-11-14 DIAGNOSIS — Z1151 Encounter for screening for human papillomavirus (HPV): Secondary | ICD-10-CM

## 2015-11-14 DIAGNOSIS — Z113 Encounter for screening for infections with a predominantly sexual mode of transmission: Secondary | ICD-10-CM

## 2015-11-14 NOTE — Progress Notes (Signed)
  Subjective:     Gabriela Bradford is a 30 y.o. female G1P1 who is here for a comprehensive physical exam. The patient reports no problems. She is sexually active using OCP for contraception  Past Medical History  Diagnosis Date  . Mitral valve prolapse syndrome     does not have to take antibiotics per patient  . Supervision of normal pregnancy 10/27/2012     [facility]  Anatomic US Normal Glucose Screen  TDaP vaccine  Flu vaccine  CF Screen  GBS  Baby Food  Contraception  Circumcision     . Medical history non-contributory    Past Surgical History  Procedure Laterality Date  . Wisdom tooth extraction    . Tonsillectomy     Family History  Problem Relation Age of Onset  . Adopted: Yes  . Diabetes Neg Hx   . Hypertension Neg Hx     Social History   Social History  . Marital Status: Married    Spouse Name: N/A  . Number of Children: N/A  . Years of Education: N/A   Occupational History  . Not on file.   Social History Main Topics  . Smoking status: Light Tobacco Smoker    Types: Cigarettes  . Smokeless tobacco: Never Used     Comment: social only when goes out  . Alcohol Use: 0.0 oz/week    0 Standard drinks or equivalent per week     Comment: social  . Drug Use: No  . Sexual Activity:    Partners: Male    Pharmacist, hospitalBirth Control/ Protection: IUD   Other Topics Concern  . Not on file   Social History Narrative   Health Maintenance  Topic Date Due  . PAP SMEAR  06/23/2008  . INFLUENZA VACCINE  12/26/2015  . TETANUS/TDAP  12/25/2022  . HIV Screening  Completed       Review of Systems Pertinent items are noted in HPI.   Objective:  Blood pressure 127/77, pulse 101, resp. rate 16, height 5\' 9"  (1.753 m), weight 131 lb (59.421 kg), last menstrual period 10/16/2015.     GENERAL: Well-developed, well-nourished female in no acute distress.  HEENT: Normocephalic, atraumatic. Sclerae anicteric.  NECK: Supple. Normal thyroid.  LUNGS: Clear to  auscultation bilaterally.  HEART: Regular rate and rhythm. BREASTS: Symmetric in size. No palpable masses or lymphadenopathy, skin changes, or nipple drainage. ABDOMEN: Soft, nontender, nondistended. No organomegaly. PELVIC: Normal external female genitalia. Vagina is pink and rugated.  Normal discharge. Normal appearing cervix. Uterus is normal in size. No adnexal mass or tenderness. EXTREMITIES: No cyanosis, clubbing, or edema, 2+ distal pulses.    Assessment:    Healthy female exam.      Plan:    pap smear and cultures collected Full STI testing per patient request Patient will be informed of abnormal results RTC in 1 year or prn See After Visit Summary for Counseling Recommendations

## 2015-11-15 ENCOUNTER — Telehealth: Payer: Self-pay | Admitting: *Deleted

## 2015-11-15 LAB — WET PREP, GENITAL: Trich, Wet Prep: NONE SEEN

## 2015-11-15 LAB — RPR

## 2015-11-15 LAB — GC/CHLAMYDIA PROBE AMP (~~LOC~~) NOT AT ARMC
Chlamydia: NEGATIVE
Neisseria Gonorrhea: NEGATIVE

## 2015-11-15 LAB — CBC
HCT: 40.3 % (ref 35.0–45.0)
Hemoglobin: 13.4 g/dL (ref 11.7–15.5)
MCH: 29.6 pg (ref 27.0–33.0)
MCHC: 33.3 g/dL (ref 32.0–36.0)
MCV: 89 fL (ref 80.0–100.0)
MPV: 10.4 fL (ref 7.5–12.5)
Platelets: 390 10*3/uL (ref 140–400)
RBC: 4.53 MIL/uL (ref 3.80–5.10)
RDW: 13.7 % (ref 11.0–15.0)
WBC: 5 10*3/uL (ref 3.8–10.8)

## 2015-11-15 LAB — HIV ANTIBODY (ROUTINE TESTING W REFLEX): HIV 1&2 Ab, 4th Generation: NONREACTIVE

## 2015-11-15 LAB — CYTOLOGY - PAP

## 2015-11-15 LAB — HEPATITIS B SURFACE ANTIGEN: Hepatitis B Surface Ag: NEGATIVE

## 2015-11-15 LAB — HEPATITIS C ANTIBODY: HCV Ab: NEGATIVE

## 2015-11-15 MED ORDER — METRONIDAZOLE 500 MG PO TABS: 500.0000 mg | ORAL_TABLET | Freq: Two times a day (BID) | ORAL | Status: DC

## 2015-11-15 MED ORDER — FLUCONAZOLE 150 MG PO TABS
150.0000 mg | ORAL_TABLET | Freq: Once | ORAL | Status: DC
Start: 2015-11-15 — End: 2015-11-17

## 2015-11-15 NOTE — Telephone Encounter (Signed)
-----   Message from Catalina AntiguaPeggy Constant, MD sent at 11/15/2015  9:37 AM EDT ----- Please inform the patient of yeast and BV infection. Rx has been e-prescribed  AnimatorThanks  Peggy

## 2015-11-15 NOTE — Addendum Note (Signed)
Addended by: Catalina AntiguaONSTANT, Redford Behrle on: 11/15/2015 09:36 AM   Modules accepted: Orders

## 2015-11-15 NOTE — Telephone Encounter (Signed)
Called pt to go over lab results - LM for pt to rtn call.  

## 2015-11-16 LAB — COMPREHENSIVE METABOLIC PANEL
ALT: 4 U/L — ABNORMAL LOW (ref 6–29)
AST: 12 U/L (ref 10–30)
Albumin: 4 g/dL (ref 3.6–5.1)
Alkaline Phosphatase: 59 U/L (ref 33–115)
BUN: 10 mg/dL (ref 7–25)
CO2: 25 mmol/L (ref 20–31)
Calcium: 9.2 mg/dL (ref 8.6–10.2)
Chloride: 105 mmol/L (ref 98–110)
Creat: 0.79 mg/dL (ref 0.50–1.10)
Glucose, Bld: 87 mg/dL (ref 65–99)
Potassium: 4.2 mmol/L (ref 3.5–5.3)
Sodium: 138 mmol/L (ref 135–146)
Total Bilirubin: 0.4 mg/dL (ref 0.2–1.2)
Total Protein: 7.2 g/dL (ref 6.1–8.1)

## 2015-11-16 LAB — LIPID PANEL
Cholesterol: 204 mg/dL — ABNORMAL HIGH (ref 125–200)
HDL: 68 mg/dL (ref >=46)
LDL Cholesterol: 113 mg/dL (ref <130)
Total CHOL/HDL Ratio: 3 Ratio (ref <=5.0)
Triglycerides: 117 mg/dL (ref <150)
VLDL: 23 mg/dL (ref <30)

## 2015-11-17 ENCOUNTER — Emergency Department
Admission: EM | Admit: 2015-11-17 | Discharge: 2015-11-17 | Disposition: A | Discharge status: Home / Self Care | Payer: BLUE CROSS/BLUE SHIELD | Attending: Emergency Medicine | Admitting: Emergency Medicine

## 2015-11-17 ENCOUNTER — Encounter: Payer: Self-pay | Admitting: Medical Oncology

## 2015-11-17 DIAGNOSIS — Z87898 Personal history of other specified conditions: Secondary | ICD-10-CM | POA: Insufficient documentation

## 2015-11-17 DIAGNOSIS — M7918 Myalgia, other site: Secondary | ICD-10-CM

## 2015-11-17 DIAGNOSIS — S199XXA Unspecified injury of neck, initial encounter: Secondary | ICD-10-CM | POA: Diagnosis present

## 2015-11-17 DIAGNOSIS — F17210 Nicotine dependence, cigarettes, uncomplicated: Secondary | ICD-10-CM | POA: Insufficient documentation

## 2015-11-17 DIAGNOSIS — Z8679 Personal history of other diseases of the circulatory system: Secondary | ICD-10-CM | POA: Insufficient documentation

## 2015-11-17 DIAGNOSIS — S161XXA Strain of muscle, fascia and tendon at neck level, initial encounter: Secondary | ICD-10-CM | POA: Diagnosis not present

## 2015-11-17 DIAGNOSIS — Y9389 Activity, other specified: Secondary | ICD-10-CM | POA: Diagnosis not present

## 2015-11-17 DIAGNOSIS — Y92410 Unspecified street and highway as the place of occurrence of the external cause: Secondary | ICD-10-CM | POA: Insufficient documentation

## 2015-11-17 DIAGNOSIS — Y999 Unspecified external cause status: Secondary | ICD-10-CM | POA: Insufficient documentation

## 2015-11-17 LAB — TSH: TSH: 0.96 mIU/L

## 2015-11-17 MED ORDER — CYCLOBENZAPRINE HCL 10 MG PO TABS: 10.0000 mg | ORAL_TABLET | Freq: Three times a day (TID) | ORAL | Status: DC | PRN

## 2015-11-17 MED ORDER — MELOXICAM 15 MG PO TABS: 15.0000 mg | ORAL_TABLET | Freq: Every day | ORAL | Status: AC
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
## 2015-11-17 NOTE — ED Notes (Signed)
Pt verbalized understanding of discharge instructions. NAD at this time. 

## 2015-11-22 ENCOUNTER — Telehealth: Payer: Self-pay | Admitting: *Deleted

## 2015-11-22 DIAGNOSIS — B379 Candidiasis, unspecified: Secondary | ICD-10-CM

## 2015-11-22 MED ORDER — FLUCONAZOLE 150 MG PO TABS: 150.0000 mg | ORAL_TABLET | Freq: Once | ORAL | Status: DC

## 2015-11-22 NOTE — Telephone Encounter (Signed)
Pt was treated for yeast and BV, states she took the Flagyl and now feels the yeast infection is worse and is experiencing severe itching, sent one refill to pharmacy for Diflucan and advised to get Monistat OTC anti itch cream.  Pt acknowledged instructions.

## 2015-11-24 ENCOUNTER — Ambulatory Visit: Payer: Medicaid Other | Admitting: Obstetrics and Gynecology

## 2016-01-30 ENCOUNTER — Telehealth: Payer: Self-pay | Admitting: *Deleted

## 2016-01-30 DIAGNOSIS — B379 Candidiasis, unspecified: Secondary | ICD-10-CM

## 2016-01-30 MED ORDER — FLUCONAZOLE 150 MG PO TABS: 150.0000 mg | ORAL_TABLET | Freq: Once | ORAL | 3 refills | Status: AC

## 2016-01-30 NOTE — Telephone Encounter (Signed)
Sent rx to pharmacy

## 2016-01-30 NOTE — Telephone Encounter (Signed)
-----   Message from Olevia BowensJacinda S Battle sent at 01/26/2016  8:55 AM EDT ----- Regarding: Rx Request Contact: [phone] Wants something called in for a yeast infection Uses [facility] on [address]

## 2016-04-01 ENCOUNTER — Telehealth: Payer: Self-pay | Admitting: *Deleted

## 2016-04-01 DIAGNOSIS — B379 Candidiasis, unspecified: Secondary | ICD-10-CM

## 2016-04-01 MED ORDER — FLUCONAZOLE 150 MG PO TABS: 150.0000 mg | ORAL_TABLET | Freq: Once | ORAL | 0 refills | Status: AC

## 2016-04-01 NOTE — Telephone Encounter (Signed)
-----   Message from Olevia BowensJacinda S Battle sent at 04/01/2016  3:10 PM EST ----- Regarding: Rx Request Contact: [phone] Wants to know if she can a diflucan called into her pharmacy, also has questions about the medicine [facility] [address]

## 2016-04-01 NOTE — Telephone Encounter (Signed)
Pt is c/o a thick white discharge with itching and irritation. Pt thinks it is linked to condom use, recommended switching to a non-latex condom and see if that helps.  Sent Diflucan to pharmacy and instructed pt on medication use.

## 2016-05-21 ENCOUNTER — Emergency Department
Admission: EM | Admit: 2016-05-21 | Discharge: 2016-05-21 | Disposition: A | Discharge status: Home / Self Care | Payer: BLUE CROSS/BLUE SHIELD | Attending: Emergency Medicine | Admitting: Emergency Medicine

## 2016-05-21 ENCOUNTER — Emergency Department: Payer: BLUE CROSS/BLUE SHIELD

## 2016-05-21 ENCOUNTER — Encounter: Payer: Self-pay | Admitting: Emergency Medicine

## 2016-05-21 DIAGNOSIS — F17210 Nicotine dependence, cigarettes, uncomplicated: Secondary | ICD-10-CM | POA: Insufficient documentation

## 2016-05-21 DIAGNOSIS — R059 Cough, unspecified: Secondary | ICD-10-CM

## 2016-05-21 DIAGNOSIS — Y999 Unspecified external cause status: Secondary | ICD-10-CM | POA: Diagnosis not present

## 2016-05-21 DIAGNOSIS — S025XXA Fracture of tooth (traumatic), initial encounter for closed fracture: Secondary | ICD-10-CM | POA: Diagnosis not present

## 2016-05-21 DIAGNOSIS — R05 Cough: Secondary | ICD-10-CM | POA: Insufficient documentation

## 2016-05-21 DIAGNOSIS — X58XXXA Exposure to other specified factors, initial encounter: Secondary | ICD-10-CM | POA: Insufficient documentation

## 2016-05-21 DIAGNOSIS — Y929 Unspecified place or not applicable: Secondary | ICD-10-CM | POA: Insufficient documentation

## 2016-05-21 DIAGNOSIS — Y9383 Activity, rough housing and horseplay: Secondary | ICD-10-CM | POA: Diagnosis not present

## 2016-05-21 DIAGNOSIS — R6884 Jaw pain: Secondary | ICD-10-CM

## 2016-05-21 DIAGNOSIS — S0993XA Unspecified injury of face, initial encounter: Secondary | ICD-10-CM | POA: Diagnosis present

## 2016-05-21 MED ORDER — ASPIRIN-ACETAMINOPHEN-CAFFEINE 250-250-65 MG PO TABS
2.0000 | ORAL_TABLET | Freq: Once | ORAL | Status: AC
  Administered 2016-05-21: 2 via ORAL
  Filled 2016-05-21: qty 2

## 2016-05-21 MED ORDER — BENZONATATE 100 MG PO CAPS: 100.0000 mg | ORAL_CAPSULE | Freq: Three times a day (TID) | ORAL | 0 refills | Status: AC | PRN

## 2016-05-21 MED ORDER — CYCLOBENZAPRINE HCL 5 MG PO TABS: 5.0000 mg | ORAL_TABLET | Freq: Three times a day (TID) | ORAL | 0 refills | Status: AC | PRN

## 2016-05-21 MED ORDER — NABUMETONE 750 MG PO TABS: 750.0000 mg | ORAL_TABLET | Freq: Two times a day (BID) | ORAL | 0 refills | Status: AC
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
## 2016-05-21 NOTE — ED Provider Notes (Signed)
[facility] Emergency Department Provider Note ____________________________________________  Time seen: 1402  I have reviewed the triage vital signs and the nursing notes.  HISTORY  Chief Complaint  URI  HPI Gabriela Bradford is a 30 y.o. female presents to the ED for evaluation of multiple complaints. Patient describes onset of a dry, nonproductive cough upon awakening this morning. She denies any interim fevers, chills, sweats. Additionally she reports some pain to the right jaw, that she believes began after horseplay with her kids earlier today. She describes discomfort with attempting opening the mouth widely. She denies any catching, clicking, locking of her jaw. She also denies any pain to the ear or tragus. Additionally she reports that she chipped the tops of 2 of her lower teeth during the same accident this morning. She denies any gum swelling, difficulty breathing, or control secretions.  Past Medical History:  Diagnosis Date  . Medical history non-contributory   . Mitral valve prolapse syndrome    does not have to take antibiotics per patient  . Supervision of normal pregnancy 10/27/2012    [facility]  Anatomic US Normal Glucose Screen  TDaP vaccine  Flu vaccine  CF Screen  GBS  Baby Food  Contraception  Circumcision       Patient Active Problem List   Diagnosis Date Noted  . Contraception management 05/11/2013  . Vulvar pain 03/30/2013  . Engorgement of breast 03/30/2013  . Mitral valve prolapse 10/28/2012    Past Surgical History:  Procedure Laterality Date  . TONSILLECTOMY    . WISDOM TOOTH EXTRACTION      Prior to Admission medications   Medication Sig Start Date End Date Taking? Authorizing Provider  benzonatate (TESSALON PERLES) 100 MG capsule Take 1 capsule (100 mg total) by mouth 3 (three) times daily as needed for cough (Take 1-2 per dose). 05/21/16   Marquail Bradwell V Bacon Presli Fanguy, PA-C  cyclobenzaprine (FLEXERIL) 5 MG  tablet Take 1 tablet (5 mg total) by mouth 3 (three) times daily as needed for muscle spasms. 05/21/16   Makella Buckingham V Bacon Drury Ardizzone, PA-C  meloxicam (MOBIC) 15 MG tablet Take 1 tablet (15 mg total) by mouth daily. 11/17/15   Charmayne Sheerharles M Beers, PA-C  nabumetone (RELAFEN) 750 MG tablet Take 1 tablet (750 mg total) by mouth 2 (two) times daily. 05/21/16   Charlesetta IvoryJenise V Bacon Hassie Mandt, PA-C    Allergies Patient has no known allergies.  Family History  Problem Relation Age of Onset  . Adopted: Yes  . Diabetes Neg Hx   . Hypertension Neg Hx     Social History Social History  Substance Use Topics  . Smoking status: Light Tobacco Smoker    Types: Cigarettes  . Smokeless tobacco: Never Used     Comment: social only when goes out  . Alcohol use 0.0 oz/week     Comment: social    Review of Systems  Constitutional: Negative for fever. Eyes: Negative for visual changes. ENT: Negative for sore throat. Dental injury as above. Cardiovascular: Negative for chest pain. Respiratory: Negative for shortness of breath. Musculoskeletal: Negative for back pain. Right jaw pain as above. Skin: Negative for rash. Neurological: Negative for focal weakness or numbness. She reports a current migraine headache. ____________________________________________  PHYSICAL EXAM:  VITAL SIGNS: ED Triage Vitals  Enc Vitals Group     BP 05/21/16 1253 120/75     Pulse Rate 05/21/16 1253 (!) 103     Resp 05/21/16 1253 16  Temp 05/21/16 1253 98.4 F (36.9 C)     Temp Source 05/21/16 1253 Oral     SpO2 05/21/16 1253 100 %     Weight 05/21/16 1252 134 lb (60.8 kg)     Height 05/21/16 1252 5\' 9"  (1.753 m)     Head Circumference --      Peak Flow --      Pain Score 05/21/16 1253 6     Pain Loc --      Pain Edu? --      Excl. in GC? --     Constitutional: Alert and oriented. Well appearing and in no distress. She has began complete sentences and has control of oral secretions. Head: Normocephalic and  atraumatic. Eyes: Conjunctivae are normal. PERRL. Normal extraocular movements Ears: Canals clear. TMs intact bilaterally. No TMJ catching, clicking, or crepitus. Atraumatic tenderness to palpation to the right masseter muscle at the angle of the jaw. No overlying bruise, contusion, edema, or swelling is noted. Nose: No congestion/rhinorrhea/epistaxis. Mouth/Throat: Mucous membranes are moist. Uvula is midline and tonsils are flat. Patient is noted to have a chip to the distal portion of her lower central and lateral incisors. No gum swelling or laxity is appreciated. Neck: Supple. No thyromegaly. Hematological/Lymphatic/Immunological: No cervical lymphadenopathy. Cardiovascular: Normal rate, regular rhythm. Normal distal pulses. Respiratory: Normal respiratory effort. No wheezes/rales/rhonchi. Musculoskeletal: Nontender with normal range of motion in all extremities.  Neurologic:  Normal gait without ataxia. Normal speech and language. No gross focal neurologic deficits are appreciated. Skin:  Skin is warm, dry and intact. No rash noted. ____________________________________________   RADIOLOGY  Mandible IMPRESSION: Negative. ____________________________________________  PROCEDURES  Excedrin ii PO ____________________________________________  INITIAL IMPRESSION / ASSESSMENT AND PLAN / ED COURSE  Patient with an unintentional chip to the lower central and left lateral incisors following horseplay with her kids earlier today. She also sustained some right lower jaw pain. She is discharged with pain and trismus to the right jaw status post contusion. She is also with a chip to 2 lower teeth with exposure of the dentin. She'll be referred to local dental provider for definitive treatment and discharged with prescriptions for Flexeril and Relafen to dose as directed. She should follow with her dental provider for ongoing jaw pain as well.  Clinical Course     ____________________________________________  FINAL CLINICAL IMPRESSION(S) / ED DIAGNOSES  Final diagnoses:  Jaw pain, non-TMJ  Closed fracture of tooth, initial encounter  Cough      Lissa HoardJenise V Bacon Kauan Kloosterman, PA-C 05/21/16 1556    Jennye MoccasinBrian S Quigley, MD 05/21/16 [phone]

## 2016-05-21 NOTE — Discharge Instructions (Signed)
Your x-ray was negative today. You appear to have some jaw muscle tension due to your recent contusion. Take the prescription meds as directed. Apply ice to the jaw as needed. Follow-up with a local dental provider for repair of your chipped teeth.

## 2016-08-06 ENCOUNTER — Encounter: Payer: Self-pay | Admitting: *Deleted

## 2016-11-20 ENCOUNTER — Ambulatory Visit: Payer: BLUE CROSS/BLUE SHIELD | Admitting: Family Medicine

## 2016-11-20 ENCOUNTER — Telehealth: Payer: Self-pay | Admitting: *Deleted

## 2016-11-20 DIAGNOSIS — Z3041 Encounter for surveillance of contraceptive pills: Secondary | ICD-10-CM

## 2016-11-20 MED ORDER — NORETHIN ACE-ETH ESTRAD-FE 1-20 MG-MCG PO TABS: 1.0000 | ORAL_TABLET | Freq: Every day | ORAL | 11 refills | Status: DC

## 2016-11-20 NOTE — Telephone Encounter (Signed)
Refill sent to pharmacy.   

## 2016-11-20 NOTE — Telephone Encounter (Signed)
-----   Message from Lindell SparHeather L Bacon, [place] sent at 11/20/2016  8:20 AM EDT ----- [facility]  Contact: [phone] Pt called and cancelled annual today stating that she started her cycle. Rescheduled for 12/18/16. But [facility]

## 2016-11-20 NOTE — Telephone Encounter (Signed)
Pt called in to confirm she needed refill on OCP. Advised pt meds are sent to preferred pharmacy

## 2016-12-18 ENCOUNTER — Ambulatory Visit: Payer: BLUE CROSS/BLUE SHIELD | Admitting: Family Medicine

## 2016-12-18 DIAGNOSIS — Z01419 Encounter for gynecological examination (general) (routine) without abnormal findings: Secondary | ICD-10-CM

## 2017-02-19 ENCOUNTER — Ambulatory Visit: Payer: BLUE CROSS/BLUE SHIELD | Admitting: Obstetrics & Gynecology

## 2017-02-28 ENCOUNTER — Ambulatory Visit: Payer: BLUE CROSS/BLUE SHIELD | Admitting: Obstetrics & Gynecology

## 2017-03-14 ENCOUNTER — Ambulatory Visit (INDEPENDENT_AMBULATORY_CARE_PROVIDER_SITE_OTHER): Payer: BLUE CROSS/BLUE SHIELD | Admitting: Obstetrics & Gynecology

## 2017-03-14 ENCOUNTER — Other Ambulatory Visit (HOSPITAL_COMMUNITY)
Admission: RE | Admit: 2017-03-14 | Discharge: 2017-03-14 | Disposition: A | Discharge status: Home / Self Care | Payer: BLUE CROSS/BLUE SHIELD | Source: Ambulatory Visit | Attending: Obstetrics & Gynecology | Admitting: Obstetrics & Gynecology

## 2017-03-14 VITALS — BP 104/70 | HR 80 | Wt 132.0 lb

## 2017-03-14 DIAGNOSIS — Z113 Encounter for screening for infections with a predominantly sexual mode of transmission: Secondary | ICD-10-CM

## 2017-03-14 DIAGNOSIS — Z01419 Encounter for gynecological examination (general) (routine) without abnormal findings: Secondary | ICD-10-CM | POA: Diagnosis not present

## 2017-03-14 DIAGNOSIS — R635 Abnormal weight gain: Secondary | ICD-10-CM

## 2017-03-14 DIAGNOSIS — Z3041 Encounter for surveillance of contraceptive pills: Secondary | ICD-10-CM

## 2017-03-14 MED ORDER — NORETHIN ACE-ETH ESTRAD-FE 1-20 MG-MCG PO TABS: 1.0000 | ORAL_TABLET | Freq: Every day | ORAL | 12 refills | Status: AC

## 2017-03-14 NOTE — Progress Notes (Signed)
Subjective:    Gabriela DadeJessica A Bradford is a 31 y.o. separated AA 13P1 (214 yo son) female who presents for an annual exam. The patient has no complaints today. The patient is sexually active. GYN screening history: last pap: was normal. The patient wears seatbelts: yes. The patient participates in regular exercise: no. Has the patient ever been transfused or tattooed?: no. The patient reports that there is not domestic violence in her life.   Menstrual History: OB History    Gravida Para Term Preterm AB Living   1 1 1     1    SAB TAB Ectopic Multiple Live Births           1      Menarche age: [age] No LMP recorded.    The following portions of the patient's history were reviewed and updated as appropriate: allergies, current medications, past family history, past medical history, past social history, past surgical history and problem list.  Review of Systems Pertinent items are noted in HPI.   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, rep Using OCPs, periods are short and light She has gained 25# in a few months She has had the Gardasil series   Objective:    BP 104/70   Pulse 80   Wt 132 lb (59.9 kg)   BMI 19.49 kg/m   General Appearance:    Alert, cooperative, no distress, appears stated age  Head:    Normocephalic, without obvious abnormality, atraumatic  Eyes:    PERRL, conjunctiva/corneas clear, EOM's intact, fundi    benign, both eyes  Ears:    Normal TM's and external ear canals, both ears  Nose:   Nares normal, septum midline, mucosa normal, no drainage    or sinus tenderness  Throat:   Lips, mucosa, and tongue normal; teeth and gums normal  Neck:   Supple, symmetrical, trachea midline, no adenopathy;    thyroid:  no enlargement/tenderness/nodules; no carotid   bruit or JVD  Back:     Symmetric, no curvature, ROM normal, no CVA tenderness  Lungs:     Clear to auscultation bilaterally, respirations unlabored  Chest Wall:    No tenderness or deformity   Heart:    Regular rate and rhythm, S1 and S2 normal, no murmur, rub   or gallop  Breast Exam:    No tenderness, masses, or nipple abnormality  Abdomen:     Soft, non-tender, bowel sounds active all four quadrants,    no masses, no organomegaly  Genitalia:    Normal female without lesion, discharge or tenderness, NSSR, NT, mobile, no adnexal masses     Extremities:   Extremities normal, atraumatic, no cyanosis or edema  Pulses:   2+ and symmetric all extremities  Skin:   Skin color, texture, turgor normal, no rashes or lesions  Lymph nodes:   Cervical, supraclavicular, and axillary nodes normal  Neurologic:   CNII-XII intact, normal strength, sensation and reflexes    throughout  .    Assessment:    Healthy female exam.    Plan:     Thin prep Pap smear.   STI testing Continue OCPs She declines a flu vaccine Weight gain- check TSH

## 2017-03-14 NOTE — Progress Notes (Signed)
Last pap 10/2015 STD TESTING TODAY

## 2017-03-15 LAB — HEPATITIS C ANTIBODY: Hep C Virus Ab: <0.1 s/co ratio (ref 0.0–0.9)

## 2017-03-15 LAB — HEPATITIS B SURFACE ANTIGEN: Hepatitis B Surface Ag: NEGATIVE

## 2017-03-15 LAB — RPR: RPR Ser Ql: NONREACTIVE

## 2017-03-15 LAB — HIV ANTIBODY (ROUTINE TESTING W REFLEX): HIV Screen 4th Generation wRfx: NONREACTIVE

## 2017-03-15 LAB — TSH: TSH: 0.637 u[IU]/mL (ref 0.450–4.500)

## 2017-03-18 ENCOUNTER — Encounter: Payer: Self-pay | Admitting: Obstetrics & Gynecology

## 2017-03-19 LAB — CYTOLOGY - PAP
Chlamydia: NEGATIVE
Diagnosis: UNDETERMINED — AB
HPV: NOT DETECTED
Neisseria Gonorrhea: NEGATIVE

## 2017-03-20 ENCOUNTER — Other Ambulatory Visit: Payer: Self-pay

## 2017-03-20 MED ORDER — NORETHIN ACE-ETH ESTRAD-FE 1-20 MG-MCG PO TABS: 1.0000 | ORAL_TABLET | Freq: Every day | ORAL | 11 refills | Status: AC

## 2017-04-04 ENCOUNTER — Telehealth: Payer: Self-pay

## 2017-04-04 NOTE — Telephone Encounter (Signed)
Called patient regarding test results. No answer to leave a message.

## 2018-01-22 ENCOUNTER — Encounter: Payer: Self-pay | Admitting: Radiology

## 2018-03-23 ENCOUNTER — Ambulatory Visit: Payer: BLUE CROSS/BLUE SHIELD | Admitting: Obstetrics & Gynecology

## 2018-03-23 DIAGNOSIS — Z01419 Encounter for gynecological examination (general) (routine) without abnormal findings: Secondary | ICD-10-CM

## 2018-04-13 ENCOUNTER — Ambulatory Visit: Payer: BLUE CROSS/BLUE SHIELD | Admitting: Obstetrics & Gynecology

## 2018-12-31 ENCOUNTER — Encounter: Payer: Self-pay | Admitting: Obstetrics & Gynecology

## 2018-12-31 ENCOUNTER — Ambulatory Visit (INDEPENDENT_AMBULATORY_CARE_PROVIDER_SITE_OTHER): Payer: BC Managed Care – PPO | Admitting: Obstetrics & Gynecology

## 2018-12-31 ENCOUNTER — Other Ambulatory Visit: Payer: Self-pay

## 2018-12-31 VITALS — BP 113/78 | HR 72 | Wt 124.6 lb

## 2018-12-31 DIAGNOSIS — Z3202 Encounter for pregnancy test, result negative: Secondary | ICD-10-CM

## 2018-12-31 DIAGNOSIS — Z1151 Encounter for screening for human papillomavirus (HPV): Secondary | ICD-10-CM

## 2018-12-31 DIAGNOSIS — Z113 Encounter for screening for infections with a predominantly sexual mode of transmission: Secondary | ICD-10-CM | POA: Diagnosis not present

## 2018-12-31 DIAGNOSIS — Z01419 Encounter for gynecological examination (general) (routine) without abnormal findings: Secondary | ICD-10-CM | POA: Diagnosis not present

## 2018-12-31 DIAGNOSIS — N912 Amenorrhea, unspecified: Secondary | ICD-10-CM

## 2018-12-31 DIAGNOSIS — Z124 Encounter for screening for malignant neoplasm of cervix: Secondary | ICD-10-CM | POA: Diagnosis not present

## 2018-12-31 LAB — POCT URINE PREGNANCY: Preg Test, Ur: NEGATIVE

## 2018-12-31 MED ORDER — NORGESTREL-ETHINYL ESTRADIOL 0.3-30 MG-MCG PO TABS: 1.0000 | ORAL_TABLET | Freq: Every day | ORAL | 11 refills | Status: DC

## 2018-12-31 NOTE — Progress Notes (Signed)
Patient would like to discuss having irregular cycle. She is currently not on birth control but like to discuss.  Last pap 2018 abnormal with ASC-US.

## 2018-12-31 NOTE — Progress Notes (Signed)
Subjective:    Gabriela Bradford is a 33 y.o. single P50 (1 yo son Cheri Rous) female who presents for an annual exam. She reports only having about 1 period per year since menarche except when she is on OCPs. She is not using contraception except for withdrawal and occasionally Plan B.  The patient is sexually active. GYN screening history: last pap: was abnormal: ASCUS negative HR HPV. The patient wears seatbelts: yes. The patient participates in regular exercise: yes. Has the patient ever been transfused or tattooed?: no. The patient reports that there is not domestic violence in her life.   Menstrual History: OB History    Gravida  1   Para  1   Term  1   Preterm      AB      Living  1     SAB      TAB      Ectopic      Multiple      Live Births  1           Menarche age: 78 No LMP recorded (lmp unknown).    The following portions of the patient's history were reviewed and updated as appropriate: allergies, current medications, past family history, past medical history, past social history, past surgical history and problem list.  Review of Systems Pertinent items are noted in HPI.   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) Monogamous for 10 months   Objective:    BP 113/78   Pulse 72   Wt 124 lb 9.6 oz (56.5 kg)   LMP  (LMP Unknown)   BMI 18.40 kg/m   General Appearance:    Alert, cooperative, no distress, appears stated age  Head:    Normocephalic, without obvious abnormality, atraumatic  Eyes:    PERRL, conjunctiva/corneas clear, EOM's intact, fundi    benign, both eyes  Ears:    Normal TM's and external ear canals, both ears  Nose:   Nares normal, septum midline, mucosa normal, no drainage    or sinus tenderness  Throat:   Lips, mucosa, and tongue normal; teeth and gums normal  Neck:   Supple, symmetrical, trachea midline, no adenopathy;    thyroid:  no enlargement/tenderness/nodules; no carotid   bruit or JVD  Back:     Symmetric, no  curvature, ROM normal, no CVA tenderness  Lungs:     Clear to auscultation bilaterally, respirations unlabored  Chest Wall:    No tenderness or deformity   Heart:    Regular rate and rhythm, S1 and S2 normal, no murmur, rub   or gallop  Breast Exam:    No tenderness, masses, or nipple abnormality  Abdomen:     Soft, non-tender, bowel sounds active all four quadrants,    no masses, no organomegaly  Genitalia:    Normal female without lesion, discharge or tenderness, normal size and shape, anteverted, mobile, non-tender, normal adnexal exam      Extremities:   Extremities normal, atraumatic, no cyanosis or edema  Pulses:   2+ and symmetric all extremities  Skin:   Skin color, texture, turgor normal, no rashes or lesions  Lymph nodes:   Cervical, supraclavicular, and axillary nodes normal  Neurologic:   CNII-XII intact, normal strength, sensation and reflexes    throughout  .    Assessment:    Healthy female exam.   Oligomenorrhea   Plan:     Thin prep Pap smear. with cotesting Check TSH, FSH, fasting labs  Start OCPs today (negative UPT today with more than 2 weeks of unprotected sex) If she gets a return of headaches with OCPs, then she will try cyclic provera

## 2019-01-01 LAB — LIPID PANEL
Chol/HDL Ratio: 2.9 ratio (ref 0.0–4.4)
Cholesterol, Total: 202 mg/dL — ABNORMAL HIGH (ref 100–199)
HDL: 69 mg/dL (ref >39)
LDL Calculated: 119 mg/dL — ABNORMAL HIGH (ref 0–99)
Triglycerides: 68 mg/dL (ref 0–149)
VLDL Cholesterol Cal: 14 mg/dL (ref 5–40)

## 2019-01-01 LAB — COMPREHENSIVE METABOLIC PANEL
ALT: 9 IU/L (ref 0–32)
AST: 17 IU/L (ref 0–40)
Albumin/Globulin Ratio: 1.5 (ref 1.2–2.2)
Albumin: 4.7 g/dL (ref 3.8–4.8)
Alkaline Phosphatase: 72 IU/L (ref 39–117)
BUN/Creatinine Ratio: 14 (ref 9–23)
BUN: 11 mg/dL (ref 6–20)
Bilirubin Total: 0.5 mg/dL (ref 0.0–1.2)
CO2: 23 mmol/L (ref 20–29)
Calcium: 9.7 mg/dL (ref 8.7–10.2)
Chloride: 98 mmol/L (ref 96–106)
Creatinine, Ser: 0.77 mg/dL (ref 0.57–1.00)
GFR calc Af Amer: 119 mL/min/{1.73_m2} (ref >59)
GFR calc non Af Amer: 103 mL/min/{1.73_m2} (ref >59)
Globulin, Total: 3.1 g/dL (ref 1.5–4.5)
Glucose: 85 mg/dL (ref 65–99)
Potassium: 4.1 mmol/L (ref 3.5–5.2)
Sodium: 135 mmol/L (ref 134–144)
Total Protein: 7.8 g/dL (ref 6.0–8.5)

## 2019-01-01 LAB — CBC
Hematocrit: 41.2 % (ref 34.0–46.6)
Hemoglobin: 13.5 g/dL (ref 11.1–15.9)
MCH: 29.7 pg (ref 26.6–33.0)
MCHC: 32.8 g/dL (ref 31.5–35.7)
MCV: 91 fL (ref 79–97)
Platelets: 273 10*3/uL (ref 150–450)
RBC: 4.55 x10E6/uL (ref 3.77–5.28)
RDW: 12.1 % (ref 11.7–15.4)
WBC: 4.4 10*3/uL (ref 3.4–10.8)

## 2019-01-01 LAB — TSH: TSH: 1.3 u[IU]/mL (ref 0.450–4.500)

## 2019-01-01 LAB — HIV ANTIBODY (ROUTINE TESTING W REFLEX): HIV Screen 4th Generation wRfx: NONREACTIVE

## 2019-01-01 LAB — RPR: RPR Ser Ql: NONREACTIVE

## 2019-01-01 LAB — FOLLICLE STIMULATING HORMONE: FSH: 6 m[IU]/mL

## 2019-01-01 LAB — HEPATITIS C ANTIBODY: Hep C Virus Ab: <0.1 s/co ratio (ref 0.0–0.9)

## 2019-01-01 LAB — HEPATITIS B SURFACE ANTIGEN: Hepatitis B Surface Ag: NEGATIVE

## 2019-01-04 LAB — CYTOLOGY - PAP
Adequacy: ABSENT
Chlamydia: NEGATIVE
Diagnosis: NEGATIVE
HPV: NOT DETECTED
Neisseria Gonorrhea: NEGATIVE
Trichomonas: NEGATIVE

## 2019-01-25 ENCOUNTER — Other Ambulatory Visit: Payer: Self-pay | Admitting: *Deleted

## 2019-01-25 MED ORDER — FLUCONAZOLE 150 MG PO TABS: 150.0000 mg | ORAL_TABLET | Freq: Once | ORAL | 1 refills | Status: AC

## 2019-09-13 ENCOUNTER — Telehealth: Payer: Self-pay | Admitting: *Deleted

## 2019-09-13 ENCOUNTER — Other Ambulatory Visit: Payer: Self-pay | Admitting: *Deleted

## 2019-09-13 MED ORDER — NORGESTREL-ETHINYL ESTRADIOL 0.5-50 MG-MCG PO TABS: 1.0000 | ORAL_TABLET | Freq: Every day | ORAL | 11 refills | Status: DC

## 2019-09-13 NOTE — Telephone Encounter (Signed)
Pt called and said she had discussed with Dr Marice Potter at her last visit about going on a higher estrogen OCP. Told pt with a provider and let her know what they say. SPoek with Dr Lennie Hummer, okay to change OCP to Ovral.

## 2019-09-16 ENCOUNTER — Telehealth: Payer: Self-pay | Admitting: *Deleted

## 2019-09-16 ENCOUNTER — Other Ambulatory Visit: Payer: Self-pay | Admitting: *Deleted

## 2019-09-16 ENCOUNTER — Encounter: Payer: Self-pay | Admitting: *Deleted

## 2019-09-16 MED ORDER — NORGESTIMATE-ETH ESTRADIOL 0.25-35 MG-MCG PO TABS: 1.0000 | ORAL_TABLET | Freq: Every day | ORAL | 11 refills | Status: AC

## 2019-09-16 MED ORDER — CRYSELLE-28 0.3-30 MG-MCG PO TABS: 1.0000 | ORAL_TABLET | Freq: Every day | ORAL | 11 refills | Status: DC

## 2019-09-16 NOTE — Telephone Encounter (Signed)
Pt left message that overal was unavailable, new RX sent in

## 2019-11-10 ENCOUNTER — Encounter: Payer: Self-pay | Admitting: Radiology

## 2019-12-14 ENCOUNTER — Telehealth: Payer: Self-pay

## 2019-12-14 MED ORDER — NORGESTREL-ETHINYL ESTRADIOL 0.3-30 MG-MCG PO TABS: 1.0000 | ORAL_TABLET | Freq: Every day | ORAL | 11 refills | Status: DC

## 2019-12-14 NOTE — Telephone Encounter (Signed)
Patient would like to switch birth control pills again to lo/ovral 03.30 mg one per day. Per Dr.Anyanwu ok to do this. Patient currently loves in [place] and will be transferring her care soon.

## 2019-12-14 NOTE — Telephone Encounter (Signed)
Called patient no there was no answer or voice mail to leave a message.

## 2019-12-14 NOTE — Telephone Encounter (Signed)
-----   Message from Rozann Lesches, NT sent at 12/14/2019  1:37 PM EDT ----- Regarding: requesting call Please call patient about rx question

## 2019-12-14 NOTE — Telephone Encounter (Signed)
-----   Message from Heather B Walker, NT sent at 12/14/2019  1:37 PM EDT ----- Regarding: requesting call Please call patient about rx question   

## 2019-12-14 NOTE — Telephone Encounter (Signed)
Attempted to call patient left message on voice mail to return our call.

## 2021-01-06 ENCOUNTER — Other Ambulatory Visit: Payer: Self-pay | Admitting: Obstetrics & Gynecology
